# Patient Record
Sex: Female | Born: 1935 | Race: White | Hispanic: No | Smoking: Never smoker
Health system: Southern US, Community
[De-identification: ages and names within clinical notes are randomized; demographics above are authoritative.]

## PROBLEM LIST (undated history)

## (undated) DIAGNOSIS — R42 Dizziness and giddiness: Secondary | ICD-10-CM

## (undated) DIAGNOSIS — K219 Gastro-esophageal reflux disease without esophagitis: Secondary | ICD-10-CM

## (undated) DIAGNOSIS — G20 Parkinson's disease: Secondary | ICD-10-CM

## (undated) DIAGNOSIS — I2510 Atherosclerotic heart disease of native coronary artery without angina pectoris: Secondary | ICD-10-CM

## (undated) DIAGNOSIS — E785 Hyperlipidemia, unspecified: Secondary | ICD-10-CM

## (undated) DIAGNOSIS — M419 Scoliosis, unspecified: Secondary | ICD-10-CM

## (undated) DIAGNOSIS — F419 Anxiety disorder, unspecified: Secondary | ICD-10-CM

## (undated) DIAGNOSIS — R519 Headache, unspecified: Secondary | ICD-10-CM

## (undated) DIAGNOSIS — M545 Low back pain: Secondary | ICD-10-CM

## (undated) DIAGNOSIS — G8929 Other chronic pain: Secondary | ICD-10-CM

## (undated) DIAGNOSIS — I6509 Occlusion and stenosis of unspecified vertebral artery: Secondary | ICD-10-CM

## (undated) DIAGNOSIS — G459 Transient cerebral ischemic attack, unspecified: Secondary | ICD-10-CM

## (undated) DIAGNOSIS — R0602 Shortness of breath: Secondary | ICD-10-CM

## (undated) DIAGNOSIS — G20A1 Parkinson's disease without dyskinesia, without mention of fluctuations: Secondary | ICD-10-CM

## (undated) DIAGNOSIS — I10 Essential (primary) hypertension: Secondary | ICD-10-CM

## (undated) DIAGNOSIS — R51 Headache: Secondary | ICD-10-CM

## (undated) DIAGNOSIS — M1990 Unspecified osteoarthritis, unspecified site: Secondary | ICD-10-CM

## (undated) DIAGNOSIS — I2699 Other pulmonary embolism without acute cor pulmonale: Secondary | ICD-10-CM

## (undated) HISTORY — PX: VAGINAL HYSTERECTOMY: SUR661

## (undated) HISTORY — PX: CARDIAC VALVE REPLACEMENT: SHX585

---

## 1987-11-05 HISTORY — PX: CORONARY ANGIOPLASTY: SHX604

## 1998-02-15 ENCOUNTER — Ambulatory Visit (HOSPITAL_COMMUNITY): Admission: RE | Admit: 1998-02-15 | Discharge: 1998-02-15 | Payer: Self-pay | Admitting: *Deleted

## 1998-04-11 ENCOUNTER — Other Ambulatory Visit: Admission: RE | Admit: 1998-04-11 | Discharge: 1998-04-11 | Payer: Self-pay | Admitting: Obstetrics & Gynecology

## 1998-06-07 ENCOUNTER — Ambulatory Visit (HOSPITAL_COMMUNITY): Admission: RE | Admit: 1998-06-07 | Discharge: 1998-06-07 | Payer: Self-pay | Admitting: Cardiology

## 1998-11-04 DIAGNOSIS — I2510 Atherosclerotic heart disease of native coronary artery without angina pectoris: Secondary | ICD-10-CM

## 1998-11-04 HISTORY — PX: CHOLECYSTECTOMY: SHX55

## 1998-11-04 HISTORY — PX: CORONARY ARTERY BYPASS GRAFT: SHX141

## 1998-11-04 HISTORY — DX: Atherosclerotic heart disease of native coronary artery without angina pectoris: I25.10

## 1998-11-04 HISTORY — PX: AORTIC VALVE REPLACEMENT: SHX41

## 1999-07-19 ENCOUNTER — Encounter: Payer: Self-pay | Admitting: Cardiology

## 1999-07-19 ENCOUNTER — Inpatient Hospital Stay (HOSPITAL_COMMUNITY): Admission: AD | Admit: 1999-07-19 | Discharge: 1999-07-20 | Payer: Self-pay | Admitting: Cardiology

## 1999-07-20 ENCOUNTER — Encounter: Payer: Self-pay | Admitting: Cardiology

## 1999-07-31 ENCOUNTER — Inpatient Hospital Stay (HOSPITAL_COMMUNITY)
Admission: RE | Admit: 1999-07-31 | Discharge: 1999-08-21 | Payer: Self-pay | Admitting: Thoracic Surgery (Cardiothoracic Vascular Surgery)

## 1999-07-31 ENCOUNTER — Encounter: Payer: Self-pay | Admitting: Thoracic Surgery (Cardiothoracic Vascular Surgery)

## 1999-08-01 ENCOUNTER — Encounter: Payer: Self-pay | Admitting: Thoracic Surgery (Cardiothoracic Vascular Surgery)

## 1999-08-02 ENCOUNTER — Encounter: Payer: Self-pay | Admitting: Thoracic Surgery (Cardiothoracic Vascular Surgery)

## 1999-08-03 ENCOUNTER — Encounter: Payer: Self-pay | Admitting: Thoracic Surgery (Cardiothoracic Vascular Surgery)

## 1999-08-05 ENCOUNTER — Encounter: Payer: Self-pay | Admitting: Thoracic Surgery (Cardiothoracic Vascular Surgery)

## 1999-08-07 ENCOUNTER — Encounter: Payer: Self-pay | Admitting: Thoracic Surgery (Cardiothoracic Vascular Surgery)

## 1999-08-10 ENCOUNTER — Encounter: Payer: Self-pay | Admitting: Thoracic Surgery (Cardiothoracic Vascular Surgery)

## 1999-08-11 ENCOUNTER — Encounter: Payer: Self-pay | Admitting: Thoracic Surgery (Cardiothoracic Vascular Surgery)

## 1999-08-14 ENCOUNTER — Encounter: Payer: Self-pay | Admitting: Thoracic Surgery (Cardiothoracic Vascular Surgery)

## 1999-08-15 ENCOUNTER — Encounter: Payer: Self-pay | Admitting: Thoracic Surgery (Cardiothoracic Vascular Surgery)

## 1999-08-16 ENCOUNTER — Encounter: Payer: Self-pay | Admitting: Thoracic Surgery (Cardiothoracic Vascular Surgery)

## 1999-08-17 ENCOUNTER — Encounter: Payer: Self-pay | Admitting: Thoracic Surgery (Cardiothoracic Vascular Surgery)

## 1999-08-20 ENCOUNTER — Encounter: Payer: Self-pay | Admitting: Thoracic Surgery (Cardiothoracic Vascular Surgery)

## 2000-01-28 ENCOUNTER — Emergency Department (HOSPITAL_COMMUNITY): Admission: EM | Admit: 2000-01-28 | Discharge: 2000-01-28 | Payer: Self-pay | Admitting: Emergency Medicine

## 2001-05-11 ENCOUNTER — Emergency Department (HOSPITAL_COMMUNITY): Admission: EM | Admit: 2001-05-11 | Discharge: 2001-05-11 | Payer: Self-pay | Admitting: Emergency Medicine

## 2001-05-11 ENCOUNTER — Encounter: Payer: Self-pay | Admitting: Emergency Medicine

## 2002-07-26 ENCOUNTER — Ambulatory Visit (HOSPITAL_COMMUNITY): Admission: RE | Admit: 2002-07-26 | Discharge: 2002-07-26 | Payer: Self-pay | Admitting: Specialist

## 2002-07-26 ENCOUNTER — Encounter: Payer: Self-pay | Admitting: Specialist

## 2003-11-05 HISTORY — PX: CATARACT EXTRACTION W/ INTRAOCULAR LENS  IMPLANT, BILATERAL: SHX1307

## 2004-11-04 DIAGNOSIS — I6509 Occlusion and stenosis of unspecified vertebral artery: Secondary | ICD-10-CM

## 2004-11-04 HISTORY — DX: Occlusion and stenosis of unspecified vertebral artery: I65.09

## 2005-04-16 ENCOUNTER — Ambulatory Visit: Payer: Self-pay | Admitting: Internal Medicine

## 2005-04-16 ENCOUNTER — Inpatient Hospital Stay (HOSPITAL_COMMUNITY): Admission: EM | Admit: 2005-04-16 | Discharge: 2005-04-19 | Payer: Self-pay | Admitting: Emergency Medicine

## 2005-04-17 ENCOUNTER — Ambulatory Visit: Payer: Self-pay | Admitting: *Deleted

## 2005-07-05 ENCOUNTER — Emergency Department (HOSPITAL_COMMUNITY): Admission: EM | Admit: 2005-07-05 | Discharge: 2005-07-06 | Payer: Self-pay | Admitting: *Deleted

## 2007-04-02 ENCOUNTER — Emergency Department (HOSPITAL_COMMUNITY): Admission: EM | Admit: 2007-04-02 | Discharge: 2007-04-02 | Payer: Self-pay | Admitting: Emergency Medicine

## 2007-06-01 ENCOUNTER — Emergency Department (HOSPITAL_COMMUNITY): Admission: EM | Admit: 2007-06-01 | Discharge: 2007-06-01 | Payer: Self-pay | Admitting: Emergency Medicine

## 2009-02-04 ENCOUNTER — Encounter: Admission: RE | Admit: 2009-02-04 | Discharge: 2009-02-04 | Payer: Self-pay | Admitting: Orthopedic Surgery

## 2011-03-22 NOTE — Consult Note (Signed)
NAMEREMEE, CHARLEY                 ACCOUNT NO.:  [id number]   MEDICAL RECORD NO.:  [id number]          PATIENT TYPE:  INP   LOCATION:  2040                         FACILITY:  [facility]   PHYSICIAN:  Jesse Sans. Wall, M.D.   DATE OF BIRTH:  Jul 30, 1925   DATE OF CONSULTATION:  DATE OF DISCHARGE:                                   CONSULTATION   We were asked by the [facility] team to evaluate Kayla Ramos, a  very pleasant 77 year old white female well-known to me with a history of  coronary artery disease and aortic valve replacement for severe aortic  stenosis in September 2000.   At the time of her surgery, she had a left internal mammary graft to the  LAD, a vein graft to a third marginal, vein graft to the distal right by Dr.  Cornelius Moras.   She had a negative stress perfusion scan August 27, 2004, with an EF of  70%.   Yesterday morning she awoke about 5 a.m. and was dizzy.  She denied vertigo.  She was nauseous and then had a series of vomiting.  She called EMS and they  brought her to the hospital.   Her cardiac enzymes have been positive with a troponin of 0.13, now down to  0.09.  Total CPK was 148 with an MB of 4.9, index of only 3.3.  EKG has  shown ST segment depression inferiorly and laterally on admission, which was  different than in the past.   PAST MEDICAL HISTORY:  She has a history of hypertension, hyperlipidemia and  vertigo in 1992.  She is status post cataract surgery in 2005, status post  cholecystectomy in 2000.   She has no known drug allergies.   MEDICATIONS:  1.  Lisinopril 20 mg a day.  2.  Valium 5 mg daily.  3.  Meclizine 25 mg p.r.n.  4.  Lipitor 20 mg a day.  5.  Prilosec 20 mg daily.  6.  HCTZ 12.5 mg a day.   SOCIAL HISTORY:  She lives in [place].  She is a widow.  She has a son  who has passed away from cancer.  Her granddaughter Toniann Fail is here today.   She does not smoke or drink.   FAMILY HISTORY:  Mother died of a heart attack  at age 36.   REVIEW OF SYSTEMS:  Remarkable for the above HPI, otherwise negative.   PHYSICAL EXAMINATION:  VITAL SIGNS:  Her blood pressure is 120/56, her pulse  is 51 and regular.  She is in sinus bradycardia.  Temperature is 97.3.  Respiratory rate is 20.  She weighs 144 pounds.  Saturation is 92% on room  air.  GENERAL:  She is a very pleasant, quite humorous lady in no acute distress.  HEENT:  She has poor dentition.  HEENT otherwise is unremarkable.  Extraocular movements intact, PERRLA, and sclerae are clear.  NECK:  No JVD.  Carotid upstrokes are equal bilaterally with a positive left  carotid bruit.  CARDIOVASCULAR:  Remarkable for a 2/6 systolic murmur.  CHEST:  Lungs are clear  to auscultation and percussion.  SKIN:  Unremarkable.  ABDOMEN:  Nondistended, good bowel sounds, no tenderness, no  hepatosplenomegaly.  EXTREMITIES:  No cyanosis, clubbing or edema.  Pulses are 2+/4+ bilaterally  and symmetrically.  Femoral pulses are strong with no bruit.  NEUROLOGIC:  Grossly intact.   The CT of the head showed no acute abnormality.  Chest x-ray shows  cardiomegaly without congestive heart failure.  EKG as mentioned above.  She  is Hemoccult-negative.  BNP was 265.5 on admit.  Hemoglobin is 12.7,  platelets 228.  BUN and creatinine 14 and 1.0, potassium 4.5.  Cardiac  enzymes as above.   A 2-D echo is pending.   ASSESSMENT:  1.  Nausea and vomiting with dizziness, with significant electrocardiogram      changes on admission.  Her troponins are positive and are coming down,      consistent with an acute coronary syndrome.  2.  History of coronary artery disease, status post coronary artery bypass      grafting September 2000.  3.  History of aortic stenosis, status post porcine aortic valve      replacement.  4.  Hypertension.  5.  Hyperlipidemia.  6.  History of depression.  7.  History of gastroesophageal reflux.   PLAN:  1.  Check 2-D echocardiogram.  2.  Cardiac  catheterization, __________ left and grafts.  3.  Continue current medications with IV heparin, aspirin.   I have discussed the need for catheterization with the patient and her  granddaughter.  They agree and understand the indications, risks, potential  benefits, and agree to proceed.   Thank you very much for the consultation.       TCW/MEDQ  D:  04/17/2005  T:  04/17/2005  Job:  [id number]   cc:   Leonette Most Record  [address]  [place]  [place] [address]  Fax: [phone]

## 2011-03-22 NOTE — Cardiovascular Report (Signed)
Kayla Ramos, Kayla Ramos                 ACCOUNT NO.:  [id number]   MEDICAL RECORD NO.:  [id number]          PATIENT TYPE:  INP   LOCATION:  2040                         FACILITY:  [facility]   PHYSICIAN:  Carole Binning, M.D. LHCDATE OF BIRTH:  Jan 09, 1925   DATE OF PROCEDURE:  04/18/2005  DATE OF DISCHARGE:                              CARDIAC CATHETERIZATION   PROCEDURES PERFORMED:  1.  Left heart catheterization with coronary angiography.  2.  Bypass graft angiography.  3.  Left ventriculogram.  4.  Abdominal aortography.   INDICATIONS:  The patient is a 77 year old woman with a history of previous  coronary artery bypass surgery and aortic valve replacement.  She presented  to the hospital with symptoms of dizziness, nausea and vomiting.  An EKG  showed ST segment depression.  Cardiac markers revealed an elevated troponin  consistent with a possible small ST segment elevation myocardial infarction.  She was evaluated by Dr. Daleen Squibb and referred for cardiac catheterization.   PROCEDURE NOTE:  A 6 French sheath was placed in the reason for admission.  The patient was very hypertensive on presentation with systolic blood  pressure greater than 210 mmHg.  She was given initially intravenous  hydralazine with minimal response.  She was then given intravenous  labetalol, which decreased her blood pressures substantially.  The native  coronary arteries were imaged with 6 [place] JL4 and JR4 catheters.  The  saphenous vein grafts were imaged with a JL4 catheter.  The internal mammary  artery was imaged with a JR4 catheter.  Left ventriculography and abdominal  aortography were performed with an angled pigtail catheter.  Contrast was  Omnipaque.  There were no complications.   CATHETERIZATION RESULTS:  Hemodynamics:  The initial aortic systolic pressure was greater than 210  mmHg.  Subsequently it decreased to as low as 95/45.  Left ventricle  pressure was 105/20.  On catheter pullback across  the aortic valve there was  a gradient which was 15 mm peak gradient and 10 mm mean gradient across the  aortic valve.   Left ventriculogram:  The left ventricle was hyperdynamic.  The ejection  fraction is greater than 70%.  There is 1+ mitral regurgitation.   Abdominal aortogram reveals a 40% stenosis in the right renal artery and 20%  in the left renal artery.  The distal abdominal aorta and iliac arteries are  heavily calcified without obstructive disease.   Coronary arteriography:  The left main is normal.   The left anterior descending artery has a 30% stenosis in the proximal  vessel.  In the mid vessel there is a tubular 90% stenosis followed by a 60%  stenosis.  The distal LAD fills via a left internal mammary artery graft.  The LAD gives rise to a single large diagonal branch.  There is a 20%  stenosis in the proximal portion of the diagonal branch.   The left circumflex has a 70% stenosis in the ostium and a 75% stenosis in  the mid vessel.  The circumflex gives rise to a small first obtuse marginal,  a normal sized second  obtuse marginal and a normal sized third obtuse  marginal branch.  The third obtuse marginal fills via saphenous vein graft.   The right coronary artery is a dominant vessel.  There is 75% stenosis in  the ostium of the right coronary artery and a diffuse 75% stenosis in the  proximal vessel.  The mid vessel has diffuse 70% stenosis.  The distal right  coronary artery gives rise to a large posterior descending artery, a small  first posterolateral branch and a large second posterolateral branch.  The  distal right coronary artery fills via saphenous vein graft.   The left internal mammary artery to the distal LAD is patent throughout its  course filling the mid and distal LAD.   The saphenous vein graft to the third obtuse marginal branch is a slender  graft with some ectatic areas which appear to be secondary to venous valves.  However, this graft is  patent and free of obstructive disease filling a  normal sized third obtuse marginal.   The saphenous vein graft to the distal right coronary artery is widely  patent throughout its course filling the distal right coronary artery.   IMPRESSION:  1.  Hyperdynamic left ventricular systolic function.  2.  Mild aortic valve gradient, status post bioprosthetic aortic valve      replacement.  3.  Native three-vessel coronary artery disease.  4.  Status post coronary artery bypass surgery.  All grafts are patent.   In summary, the patient appears to be well revascularized.  No obvious  culprit is identified for the patient's elevated troponin level.   PLAN:  The patient will be managed medically.  We will check a D-dimer as a  screen for a possible pulmonary embolism.       MWP/MEDQ  D:  04/18/2005  T:  04/18/2005  Job:  [id number]   cc:   Leonette Most Record  [address]  [place]  [place] [address]  Fax: [phone]   Jesse Sans. Wall, M.D.   [facility]

## 2011-03-22 NOTE — Discharge Summary (Signed)
NAMENORVELL, URESTE                 ACCOUNT NO.:  [id number]   MEDICAL RECORD NO.:  [id number]          PATIENT TYPE:  INP   LOCATION:  2040                         FACILITY:  [facility]   PHYSICIAN:  Ileana Roup, M.D.  DATE OF BIRTH:  1925/04/01   DATE OF ADMISSION:  04/16/2005  DATE OF DISCHARGE:  04/19/2005                                 DISCHARGE SUMMARY   DISCHARGE DIAGNOSES:  1.  Dizziness, most probably secondary to inner ear pathology, question      vertebral TAA.  2.  Coronary artery disease.  3.  Hyperglycemia.  4.  Hypertension.  5.  Dyslipidemia.  6.  Aortic prosthesis valve.   DISCHARGE MEDICATIONS:  1.  Lisinopril 20 mg p.o. daily.  2.  Diazepam 5 mg p.o. daily.  3.  Meclizine 25 mg p.o. daily.  4.  Lipitor 20 mg p.o. daily.  5.  Hydrochlorothiazide 12.5 mg daily.  6.  Prilosec OTC 20 mg p.o. daily.   DISPOSITION:  The patient is to go home with hospital followup with her  primary physician, Dr. Record, on April 25, 2005 at 9:45 a.m.   CONSULTATIONS:  Dr. Valera Castle from cardiology.   PROCEDURES:  1.  Chest x-ray April 16, 2005:  Cardiomegaly without CHF.  2.  CT scan of the head without contrast on April 16, 2005:  Mild atrophy and      small vessel ischemic disease.  No acute or focal abnormalities.  3.  2-D echo on April 17, 2005:  Left ventricular systolic function was      hyperdynamic.  Left ventricular ejection fraction was estimated, range      being 75-85%.  There was moderate focal basal septal hypertrophy.  There      was a porcine bioprosthetic aortic valve.  The mean transaortic valve      gradient was 3.1 mmHg.  Mitral regurgitation was 1+ on a scale of 0 to      4+.  The left atrium was dilated.  Estimated peak right ventricular      systolic pressure was 50 mg in the range of 45-55.  Tricuspid      regurgitation grade was 1-2+ on a scale of 0 to 4.  4.  MRI of the brain:  1)  Atrophy and small vessel disease.  2)  No acute      stroke.  3) Mild  grade one enhancement of the meninges felt to be within      normal limits.  5.  MRA of the head:  75-90% stenosis distal right vertebral.  6.  MRI of the neck on April 17, 2005:  No evidence of carotid      atherosclerotic change.  75% distal right vertebral stenosis.  7.  Cardiac catheterization on April 18, 2005:  1) Hyperdynamic left      ventricular systolic function.  2) Mild aortic valve gradient, status      post bioprosthetic aortic valve replacement.  3) Patent native three-      vessel coronary artery disease.  4) Status post coronary artery bypass  surgery.  All grafts are patent.  In summary, the patient appeared to be      well revascularized.  No obvious culprit is identified for the patient's      elevated troponin level.  Plan: The patient will be managed medically.      We will check a D-dimer as a screen for a possible pulmonary embolism.   HISTORY OF PRESENT ILLNESS:  This 77 year old woman was admitted with  significant past medical history for coronary artery disease, status post  CABG in 2000, hypertension, dyslipidemia, GERD, vertigo.  Comes to the  emergency room complaining of dizziness.  She woke up around 5 or 5:30 a.m.  this morning and felt dizzy plus nausea.  She denies vertigo.  Then she  noticed also she was unbalanced.  Because of the persistence of nausea,  vomiting, dizziness, she decided to come to the emergency room and we saw  her this afternoon at the hospital.  She denied chest pain, shortness of  breath, headache, weakness, tinnitus, or fullness or hair loss.  No  hematemesis, no diarrhea.   PHYSICAL EXAMINATION:  VITAL SIGNS:  At physical examination, pulse was 102,  blood pressure 177/89, temperature 97.6, respiratory rate 28, oxygen  saturation 95% on room air.  HEENT:  Eyes were PERRLA.  She had a grade 1 nystagmus, unidirectional to  __________ with auditory component, rhythmic.   LABORATORY DATA ON ADMISSION:  Sodium 134, potassium 3.3,  chloride 97, CO2  22, BUN 19, creatinine 1.2, glucose 240.  CBC:  White blood cell count 15.6,  hemoglobin 15.4, hematocrit 44.1, platelets 291, MCV 88.5.   HOSPITAL COURSE:  Problem #1.  Dizziness.  The patient presented with  dizziness and no chest pain.  Also a nystagmus was found so after all the  work-up for stroke and also some __________ pathology, everything came back  negative. So this was thought to be secondary the either inner ear problems  or vertebral insufficiency.  The patient, after 12 hours of admission, was  continued on her meclizine and she was free of symptoms.  However, at the  initial work-up in the emergency room, cardiac enzymes were drawn and  troponin-I came back elevated.  For that reason, cardiology was consulted  who, because of the history of grafts, he decided to do a cardiac  catheterization.  Troponin went from 0.13 to 0.09 to 0.01.  As already  mentioned, cardiac catheterization was completely normal and the patient was  completely free of symptoms and for that reason it was decided her  discharge.   Problem #2.  Coronary artery disease.  She was continued on her regular  medication and she did not present any problem from this point of view.  Catheterization showed normal patent bypass.   Problem #3.  Hyperglycemia.  During this hospitalization, the patient  presented with some hyperglycemia with tendency to decrease.  However, she  continues to have some elevated glucoses in the a.m.  So for that reason,  this needed to be followed up as an outpatient.  She can be developing new  onset diabetes. Hemoglobin A1C was 5.7.   Problem #4.  Hypertension.  She was continued on her regular medications and  she did not present any problem from this point of view.   Problem #5.  Dyslipidemia.  Also she was continued on her regular  medications.  Problem #6.  Aortic prosthetic valve.  She did not present any problem from  this point of view  clinically or  at 2-D echo or at catheterization.  However, blood cultures were drawn x4 and one of them came back positive for  coagulase negative staph which we think is a contamination.  However, we did  two blood cultures on June 13 and two blood cultures on June 15 which at the  moment of her discharge were with no growth to date.  Blood culture need to  be followed up due to the history of prosthetic valve.   Finally, I had a conversation with primary physician, Dr. Record, about the  use of beta blockers in this patient with a history of coronary artery  disease.  However, he said that it was tried in the past.  However, the  patient got very tired with the use beta blockers.  For that reason, they  were stopped. We did not want to send her for this reason beta blockers and  this needs to be decided in the future by her primary care physician.   LABORATORY DATA ON DISCHARGE:  Sodium 138, potassium 4.1, chloride 110, CO2  25, glucose 79, BUN 3, creatinine 1, calcium 8.3.  CBC:  White blood cell  count 6, hemoglobin 11.4, hematocrit 32.4, MCV 89.3, platelet count 197.  D-  dimers were 0.45 so for that reason no further work-up was done.       YC/MEDQ  D:  04/22/2005  T:  04/23/2005  Job:  [id number]   cc:   Leonette Most Record  [address]  [place]  [place] [address]  Fax: [phone]

## 2011-03-22 NOTE — Op Note (Signed)
[facility]  Patient:    Kayla Ramos                         MRN: [id number] Proc. Date: 08/17/99 Adm. Date:  [id number] Attending:  Tressie Stalker CC:         Salvatore Decent. Cornelius Moras, M.D.                           Operative Report  PREOPERATIVE DIAGNOSIS:  Early acute cholecystitis.  POSTOPERATIVE DIAGNOSIS:  Acute cholecystitis.  PROCEDURE:  Laparoscopic cholecystectomy with intraoperative cholangiogram.  SURGEON: Adolph Pollack, M.D.  ASSISTANT:  Currie Paris, M.D.  ANESTHESIA:  General.  INDICATIONS:  This is a 77 year old female who underwent a coronary artery bypass and aortic valve replacement on July 31, 1999.  She had been having some nausea and vomiting and upper GI type complaints.  She had an ultrasound which showed some gallbladder sludge with thickened wall and a HIDA scan that showed nonvisualization of the gallbladder consistent with acute cystic duct obstruction. She is now brought to the operating room.  DESCRIPTION OF PROCEDURE:  She was placed supine on the operating table and general anesthetic was administered.  The abdomen was sterilely prepped and draped.  A subumbilical incision was made incising the skin sharply.  The subcutaneous tissue was dissected bluntly until the fascia was visualized.  A 1 cm incision was made in the fascia.  A purse-string suture of 0 Vicryl was placed around the fascial edges.  The peritoneal cavity was entered bluntly and under direct vision.  A Hasson trocar was introduced into the peritoneal cavity and a pneumoperitoneum was created by insufflation of CO2 gas.  The laparoscope was introduced. The gallbladder was irregular in color and appeared edematous.  Next, a 10 mm incision was made in the epigastrium and a similar sized trocar was placed.  Two 5 mm trocars were placed in the right abdomen.  The fundus of the gallbladder was grasped and omental adhesions and  duodenal adhesions were taken down from the gallbladder bluntly.  The infundibulum of the gallbladder was identified and retracted toward to right.  Using careful blunt dissection, I was able to identify the cystic duct and its junction with the gallbladder.  I ______ for some distance.  I also identified an anterior and posterior branch of the cystic artery.  I clipped the cystic duct at its junction with the gallbladder nd performed a partial ductotomy.  I milked some sludge out of the cystic duct.  A  cholangiocath was then introduced to the anterior abdominal wall and a cholangiogram was performed which demonstrated no common duct filling defects.  This will be more detailed in a separate report.  The cholangiocatheter was then removed, the cystic duct was clipped three times  proximally and then divided.  The anterior branch and the posterior branch of the cystic artery were clipped twice proximally and once distally and divided.  The  gallbladder was then dissected free from the liver bed.  There was a small amount of bile spillage and this was evacuated.  The gallbladder was then placed in an  Endocatch bag. Small flecks of stone were noted.  This was consistent with microlithiasis.  The perihepatic area was copiously irrigated with saline and clots and any type of bilious fluid evacuated.  No bile leakage or bleeding was noted from the liver ed after it  had been cauterized.  A piece of Surgicel was placed in the liver bed because the patient was likely going to go back on the Coumadin.  The gallbladder was removed through the subumbilical port and the fascial defect was closed by tightening up and tying down the purse-string suture.  The perihepatic area was once again irrigated and the fluid was evacuated.  All trocars were removed and the pneumoperitoneum was released.  The skin incisions were then closed with 4-0 Monocryl subcuticular stitches followed by  Steri-Strips and sterile dressings.  She tolerated the procedure well without any apparent complications.  She will remain on intravenous antibiotics.  She was taken to the recovery room in satisfactory condition. DD:  08/17/99 TD:  08/19/99 Job: [id number]

## 2011-04-11 ENCOUNTER — Emergency Department (INDEPENDENT_AMBULATORY_CARE_PROVIDER_SITE_OTHER): Payer: Medicare Other

## 2011-04-11 ENCOUNTER — Emergency Department (HOSPITAL_BASED_OUTPATIENT_CLINIC_OR_DEPARTMENT_OTHER)
Admission: EM | Admit: 2011-04-11 | Discharge: 2011-04-11 | Disposition: A | Discharge status: Home / Self Care | Payer: Medicare Other | Attending: Emergency Medicine | Admitting: Emergency Medicine

## 2011-04-11 DIAGNOSIS — I517 Cardiomegaly: Secondary | ICD-10-CM

## 2011-04-11 DIAGNOSIS — G8929 Other chronic pain: Secondary | ICD-10-CM | POA: Insufficient documentation

## 2011-04-11 DIAGNOSIS — W19XXXA Unspecified fall, initial encounter: Secondary | ICD-10-CM

## 2011-04-11 DIAGNOSIS — Y92009 Unspecified place in unspecified non-institutional (private) residence as the place of occurrence of the external cause: Secondary | ICD-10-CM | POA: Insufficient documentation

## 2011-04-11 DIAGNOSIS — I10 Essential (primary) hypertension: Secondary | ICD-10-CM | POA: Insufficient documentation

## 2011-04-11 DIAGNOSIS — R51 Headache: Secondary | ICD-10-CM

## 2011-04-11 DIAGNOSIS — E785 Hyperlipidemia, unspecified: Secondary | ICD-10-CM | POA: Insufficient documentation

## 2011-04-11 DIAGNOSIS — R55 Syncope and collapse: Secondary | ICD-10-CM

## 2011-04-11 DIAGNOSIS — Z79899 Other long term (current) drug therapy: Secondary | ICD-10-CM | POA: Insufficient documentation

## 2011-04-11 DIAGNOSIS — G319 Degenerative disease of nervous system, unspecified: Secondary | ICD-10-CM

## 2011-04-11 LAB — BASIC METABOLIC PANEL
BUN: 18 mg/dL (ref 6–23)
CO2: 24 mEq/L (ref 19–32)
GFR calc Af Amer: >60 mL/min (ref >60)
GFR calc non Af Amer: >60 mL/min (ref >60)
Potassium: 3.8 mEq/L (ref 3.5–5.1)
Sodium: 145 mEq/L (ref 135–145)

## 2011-04-11 LAB — CBC
MCH: 30.9 pg (ref 26.0–34.0)
MCV: 90.7 fL (ref 78.0–100.0)
Platelets: 207 10*3/uL (ref 150–400)
RDW: 14.1 % (ref 11.5–15.5)

## 2011-04-11 LAB — DIFFERENTIAL
Basophils Absolute: 0 10*3/uL (ref 0.0–0.1)
Basophils Relative: 0 % (ref 0–1)
Eosinophils Absolute: 0.1 10*3/uL (ref 0.0–0.7)
Monocytes Absolute: 0.7 10*3/uL (ref 0.1–1.0)
Neutro Abs: 5.7 10*3/uL (ref 1.7–7.7)
Neutrophils Relative %: 76 % (ref 43–77)

## 2011-04-11 LAB — TROPONIN I: Troponin I: <0.3 ng/mL (ref <0.30)

## 2011-04-11 LAB — URINALYSIS, ROUTINE W REFLEX MICROSCOPIC
Ketones, ur: NEGATIVE mg/dL
Leukocytes, UA: NEGATIVE
Specific Gravity, Urine: 1.012 (ref 1.005–1.030)
pH: 5.5 (ref 5.0–8.0)

## 2011-04-11 LAB — URINE MICROSCOPIC-ADD ON

## 2011-04-12 LAB — URINE CULTURE: Culture: NO GROWTH

## 2011-07-06 DIAGNOSIS — G459 Transient cerebral ischemic attack, unspecified: Secondary | ICD-10-CM

## 2011-07-06 HISTORY — DX: Transient cerebral ischemic attack, unspecified: G45.9

## 2011-09-05 ENCOUNTER — Emergency Department (HOSPITAL_BASED_OUTPATIENT_CLINIC_OR_DEPARTMENT_OTHER)
Admission: EM | Admit: 2011-09-05 | Discharge: 2011-09-06 | Disposition: A | Discharge status: Other Acute Inpatient Hospital | Payer: Medicare Other | Attending: Emergency Medicine | Admitting: Emergency Medicine

## 2011-09-05 ENCOUNTER — Emergency Department (HOSPITAL_BASED_OUTPATIENT_CLINIC_OR_DEPARTMENT_OTHER): Payer: Medicare Other

## 2011-09-05 ENCOUNTER — Other Ambulatory Visit: Payer: Self-pay

## 2011-09-05 ENCOUNTER — Encounter: Payer: Self-pay | Admitting: *Deleted

## 2011-09-05 DIAGNOSIS — I2510 Atherosclerotic heart disease of native coronary artery without angina pectoris: Secondary | ICD-10-CM | POA: Insufficient documentation

## 2011-09-05 DIAGNOSIS — R5381 Other malaise: Secondary | ICD-10-CM | POA: Insufficient documentation

## 2011-09-05 DIAGNOSIS — G8929 Other chronic pain: Secondary | ICD-10-CM | POA: Insufficient documentation

## 2011-09-05 DIAGNOSIS — R001 Bradycardia, unspecified: Secondary | ICD-10-CM

## 2011-09-05 DIAGNOSIS — E785 Hyperlipidemia, unspecified: Secondary | ICD-10-CM | POA: Insufficient documentation

## 2011-09-05 DIAGNOSIS — R29810 Facial weakness: Secondary | ICD-10-CM | POA: Insufficient documentation

## 2011-09-05 DIAGNOSIS — G319 Degenerative disease of nervous system, unspecified: Secondary | ICD-10-CM | POA: Insufficient documentation

## 2011-09-05 DIAGNOSIS — M75100 Unspecified rotator cuff tear or rupture of unspecified shoulder, not specified as traumatic: Secondary | ICD-10-CM | POA: Insufficient documentation

## 2011-09-05 DIAGNOSIS — R531 Weakness: Secondary | ICD-10-CM

## 2011-09-05 DIAGNOSIS — IMO0002 Reserved for concepts with insufficient information to code with codable children: Secondary | ICD-10-CM | POA: Insufficient documentation

## 2011-09-05 DIAGNOSIS — M549 Dorsalgia, unspecified: Secondary | ICD-10-CM | POA: Insufficient documentation

## 2011-09-05 DIAGNOSIS — I10 Essential (primary) hypertension: Secondary | ICD-10-CM | POA: Insufficient documentation

## 2011-09-05 DIAGNOSIS — M4120 Other idiopathic scoliosis, site unspecified: Secondary | ICD-10-CM | POA: Insufficient documentation

## 2011-09-05 DIAGNOSIS — R42 Dizziness and giddiness: Secondary | ICD-10-CM | POA: Insufficient documentation

## 2011-09-05 DIAGNOSIS — I498 Other specified cardiac arrhythmias: Secondary | ICD-10-CM | POA: Insufficient documentation

## 2011-09-05 HISTORY — DX: Essential (primary) hypertension: I10

## 2011-09-05 HISTORY — DX: Hyperlipidemia, unspecified: E78.5

## 2011-09-05 HISTORY — DX: Dizziness and giddiness: R42

## 2011-09-05 HISTORY — DX: Atherosclerotic heart disease of native coronary artery without angina pectoris: I25.10

## 2011-09-05 HISTORY — DX: Scoliosis, unspecified: M41.9

## 2011-09-05 LAB — CBC
HCT: 37.9 % (ref 36.0–46.0)
Hemoglobin: 12.5 g/dL (ref 12.0–15.0)
MCHC: 33 g/dL (ref 30.0–36.0)
RBC: 4.08 MIL/uL (ref 3.87–5.11)
WBC: 12.8 10*3/uL — ABNORMAL HIGH (ref 4.0–10.5)

## 2011-09-05 LAB — URINALYSIS, ROUTINE W REFLEX MICROSCOPIC
Bilirubin Urine: NEGATIVE
Glucose, UA: NEGATIVE mg/dL
Ketones, ur: NEGATIVE mg/dL
Leukocytes, UA: NEGATIVE
Specific Gravity, Urine: 1.015 (ref 1.005–1.030)
pH: 5.5 (ref 5.0–8.0)

## 2011-09-05 LAB — DIFFERENTIAL
Lymphocytes Relative: 13 % (ref 12–46)
Lymphs Abs: 1.6 10*3/uL (ref 0.7–4.0)
Monocytes Absolute: 0.8 10*3/uL (ref 0.1–1.0)
Monocytes Relative: 6 % (ref 3–12)
Neutro Abs: 10.2 10*3/uL — ABNORMAL HIGH (ref 1.7–7.7)

## 2011-09-05 NOTE — ED Notes (Signed)
Pt states she had an episode earlier of dizziness lasting "just a few seconds"-pt denies pain-grand daughter states pt has been weak and concerned that same symptoms in the past with UTI

## 2011-09-05 NOTE — ED Provider Notes (Addendum)
History     CSN: [id number] Arrival date & time: 09/05/2011 10:19 PM   First MD Initiated Contact with Patient 09/05/11 2328      Chief Complaint  Patient presents with  . Fatigue    (Consider location/radiation/quality/duration/timing/severity/associated sxs/prior treatment) HPI History provided primarily by the patient's granddaughter. This is an 77 year old white female who has been feeling less energetic than usual for about a week which became profoundly worse today. She's been unable to get out of bed or to ambulate. She has not had any focal weakness except for mild right facial droop noted this afternoon. She's not had a fever. She has not been vomiting. She has not had any chest pain, dyspnea, abdominal pain or diarrhea. She has a history of similar symptoms due to a UTI in the past. Her symptoms are described as moderate to severe.  Past Medical History  Diagnosis Date  . Hypertension   . Vertigo   . CAD (coronary artery disease)   . Hyperlipidemia   . Chronic back pain   . Scoliosis     Past Surgical History  Procedure Date  . Coronary artery bypass graft   . Cholecystectomy     History reviewed. No pertinent family history.  History  Substance Use Topics  . Smoking status: Never Smoker   . Smokeless tobacco: Not on file  . Alcohol Use: No    OB History    Grav Para Term Preterm Abortions TAB SAB Ect Mult Living                  Review of Systems  All other systems reviewed and are negative.    Allergies  Review of patient's allergies indicates no known allergies.  Home Medications   Current Outpatient Rx  Name Route Sig Dispense Refill  . ATORVASTATIN CALCIUM 40 MG PO TABS Oral Take 40 mg by mouth daily.      Marland Kitchen DIAZEPAM 5 MG PO TABS Oral Take 5 mg by mouth 2 (two) times daily as needed. For anxiety    . FELODIPINE 5 MG PO TB24 Oral Take 5 mg by mouth daily.      Marland Kitchen GABAPENTIN 300 MG PO CAPS Oral Take 100-300 mg by mouth at bedtime as needed.  For chronic pain     . HYDROCODONE-ACETAMINOPHEN 7.5-325 MG PO TABS Oral Take 1 tablet by mouth 3 (three) times daily as needed. For severe pain     . LISINOPRIL 20 MG PO TABS Oral Take 20 mg by mouth daily.      Marland Kitchen MECLIZINE HCL 25 MG PO TABS Oral Take 25 mg by mouth 3 (three) times daily as needed. For dizziness     . MELOXICAM 7.5 MG PO TABS Oral Take 7.5 mg by mouth daily.        BP 149/62  Pulse 62  Temp(Src) 98.1 F (36.7 C) (Rectal)  Resp 16  Wt 123 lb (55.792 kg)  SpO2 93%  Physical Exam General: Well-developed, well-nourished female in no acute distressHENT: normocephalic, atraumatic Eyes: pupils equal round and reactive to light; extraocular muscles intact Neck: supple Heart: regular rate and rhythm; no murmurs Lungs: clear to auscultation bilaterally Abdomen: soft; nontender; nondistended; bowel sounds present Extremities: No deformity; no edema; pulses normal Neurologic: Awake, lethargic but oriented; motor function intact in all extremities and symmetric; mild right facial droop  Skin: Warm and dry    ED Course  Procedures (including critical care time)    MDM   EKG Interpretation:  Date & Time: 09/05/2011 10:45 PM  Rate: 59  Rhythm: sinus bradycardia  QRS Axis: normal  Intervals: normal  ST/T Wave abnormalities: T-wave inversions inferiorly and laterally  Conduction Disutrbances:poor R-wave progression  Narrative Interpretation:   Old EKG Reviewed: none available  Nursing notes and vitals signs, including pulse oximetry, reviewed.  Summary of this visit's results, reviewed by myself:  Labs:  Results for orders placed during the hospital encounter of 09/05/11  CBC      Component Value Range   WBC 12.8 (*) 4.0 - 10.5 (K/uL)   RBC 4.08  3.87 - 5.11 (MIL/uL)   Hemoglobin 12.5  12.0 - 15.0 (g/dL)   HCT 14.7  82.9 - 56.2 (%)   MCV 92.9  78.0 - 100.0 (fL)   MCH 30.6  26.0 - 34.0 (pg)   MCHC 33.0  30.0 - 36.0 (g/dL)   RDW 13.0  86.5 - 78.4 (%)    Platelets 217  150 - 400 (K/uL)  DIFFERENTIAL      Component Value Range   Neutrophils Relative 80 (*) 43 - 77 (%)   Neutro Abs 10.2 (*) 1.7 - 7.7 (K/uL)   Lymphocytes Relative 13  12 - 46 (%)   Lymphs Abs 1.6  0.7 - 4.0 (K/uL)   Monocytes Relative 6  3 - 12 (%)   Monocytes Absolute 0.8  0.1 - 1.0 (K/uL)   Eosinophils Relative 1  0 - 5 (%)   Eosinophils Absolute 0.2  0.0 - 0.7 (K/uL)   Basophils Relative 0  0 - 1 (%)   Basophils Absolute 0.0  0.0 - 0.1 (K/uL)  BASIC METABOLIC PANEL      Component Value Range   Sodium 141  135 - 145 (mEq/L)   Potassium 3.7  3.5 - 5.1 (mEq/L)   Chloride 107  96 - 112 (mEq/L)   CO2 25  19 - 32 (mEq/L)   Glucose, Bld 102 (*) 70 - 99 (mg/dL)   BUN 29 (*) 6 - 23 (mg/dL)   Creatinine, Ser 6.96  0.50 - 1.10 (mg/dL)   Calcium 8.7  8.4 - 29.5 (mg/dL)   GFR calc non Af Amer 45 (*) >90 (mL/min)   GFR calc Af Amer 52 (*) >90 (mL/min)  URINALYSIS, ROUTINE W REFLEX MICROSCOPIC      Component Value Range   Color, Urine YELLOW  YELLOW    Appearance CLEAR  CLEAR    Specific Gravity, Urine 1.015  1.005 - 1.030    pH 5.5  5.0 - 8.0    Glucose, UA NEGATIVE  NEGATIVE (mg/dL)   Hgb urine dipstick NEGATIVE  NEGATIVE    Bilirubin Urine NEGATIVE  NEGATIVE    Ketones, ur NEGATIVE  NEGATIVE (mg/dL)   Protein, ur NEGATIVE  NEGATIVE (mg/dL)   Urobilinogen, UA 0.2  0.0 - 1.0 (mg/dL)   Nitrite NEGATIVE  NEGATIVE    Leukocytes, UA NEGATIVE  NEGATIVE   TROPONIN I      Component Value Range   Troponin I <0.30  <0.30 (ng/mL)    Dg Chest 2 View  09/06/2011  *RADIOLOGY REPORT*  Clinical Data: Weakness.  Hyperglycemia.  CHEST - 2 VIEW  Comparison: 04/11/2011  Findings: Stable postoperative changes in the mediastinum.  Cardiac enlargement with mild increased pulmonary vascularity.  No perihilar edema.  No blunting of costophrenic angles.  Linear atelectasis or fibrosis in the lung bases.  No focal airspace consolidation.  Calcified and tortuous aorta.  No significant changes  since the previous study, allowing  for differences in technique.  IMPRESSION: Cardiac enlargement with mild prominence of pulmonary vascularity. No evidence of pulmonary edema or consolidation.  Original Report Authenticated By: Marlon Pel, M.D.   Ct Head Wo Contrast  09/06/2011  *RADIOLOGY REPORT*  Clinical Data: Fatigue, weakness, dizziness, right facial droop.  CT HEAD WITHOUT CONTRAST  Technique:  Contiguous axial images were obtained from the base of the skull through the vertex without contrast.  Comparison: 04/11/2011  Findings: Diffuse cerebral atrophy.  Low attenuation change in the deep white matter consistent with small vessel ischemic change.  No mass effect or midline shift.  No abnormal extra-axial fluid collections.  Ventricular dilatation consistent with central atrophy.  Gray-white matter junctions are distinct.  Basal cisterns are not effaced.  No evidence of acute intracranial hemorrhage. Stable appearance of the intracranial contents since the previous study.  Visualized paranasal sinuses are not opacified.  No depressed skull fractures.  Vascular calcifications in the intracranial arteries.  IMPRESSION: Diffuse atrophy and small vessel ischemic changes.  No evidence of acute intracranial hemorrhage, mass lesion, or acute infarct.  No significant change since previous study.  Original Report Authenticated By: Marlon Pel, M.D.            Hanley Seamen, MD 09/06/11 0139  Hanley Seamen, MD 09/06/11 7829

## 2011-09-05 NOTE — ED Notes (Signed)
CBG 97 per EMS.

## 2011-09-05 NOTE — ED Notes (Signed)
Family states that in the past when pt has been weak like this it was caused by an UTI. Pt wears a depends at all times.

## 2011-09-05 NOTE — ED Notes (Signed)
Pt states that she has had generalized weakness since yesterday. No complaints of pain, no N/V/D

## 2011-09-06 ENCOUNTER — Emergency Department (INDEPENDENT_AMBULATORY_CARE_PROVIDER_SITE_OTHER): Payer: Medicare Other

## 2011-09-06 ENCOUNTER — Inpatient Hospital Stay (HOSPITAL_COMMUNITY)
Admission: RE | Admit: 2011-09-06 | Discharge: 2011-09-10 | DRG: 069 | Disposition: A | Discharge status: Home / Self Care | Payer: Medicare Other | Source: Other Acute Inpatient Hospital | Attending: Family Medicine | Admitting: Family Medicine

## 2011-09-06 ENCOUNTER — Inpatient Hospital Stay (HOSPITAL_COMMUNITY): Payer: Medicare Other

## 2011-09-06 ENCOUNTER — Other Ambulatory Visit: Payer: Self-pay

## 2011-09-06 ENCOUNTER — Emergency Department (HOSPITAL_BASED_OUTPATIENT_CLINIC_OR_DEPARTMENT_OTHER): Payer: Medicare Other

## 2011-09-06 DIAGNOSIS — Z79899 Other long term (current) drug therapy: Secondary | ICD-10-CM

## 2011-09-06 DIAGNOSIS — Z7902 Long term (current) use of antithrombotics/antiplatelets: Secondary | ICD-10-CM

## 2011-09-06 DIAGNOSIS — R5383 Other fatigue: Secondary | ICD-10-CM

## 2011-09-06 DIAGNOSIS — I517 Cardiomegaly: Secondary | ICD-10-CM

## 2011-09-06 DIAGNOSIS — I10 Essential (primary) hypertension: Secondary | ICD-10-CM | POA: Diagnosis present

## 2011-09-06 DIAGNOSIS — R7309 Other abnormal glucose: Secondary | ICD-10-CM

## 2011-09-06 DIAGNOSIS — I369 Nonrheumatic tricuspid valve disorder, unspecified: Secondary | ICD-10-CM

## 2011-09-06 DIAGNOSIS — I6509 Occlusion and stenosis of unspecified vertebral artery: Secondary | ICD-10-CM | POA: Diagnosis present

## 2011-09-06 DIAGNOSIS — E785 Hyperlipidemia, unspecified: Secondary | ICD-10-CM | POA: Diagnosis present

## 2011-09-06 DIAGNOSIS — R42 Dizziness and giddiness: Secondary | ICD-10-CM

## 2011-09-06 DIAGNOSIS — I2510 Atherosclerotic heart disease of native coronary artery without angina pectoris: Secondary | ICD-10-CM | POA: Diagnosis present

## 2011-09-06 DIAGNOSIS — R5381 Other malaise: Secondary | ICD-10-CM

## 2011-09-06 DIAGNOSIS — Z9889 Other specified postprocedural states: Secondary | ICD-10-CM

## 2011-09-06 DIAGNOSIS — Z951 Presence of aortocoronary bypass graft: Secondary | ICD-10-CM

## 2011-09-06 DIAGNOSIS — G319 Degenerative disease of nervous system, unspecified: Secondary | ICD-10-CM

## 2011-09-06 DIAGNOSIS — R350 Frequency of micturition: Secondary | ICD-10-CM | POA: Diagnosis present

## 2011-09-06 DIAGNOSIS — I6789 Other cerebrovascular disease: Secondary | ICD-10-CM

## 2011-09-06 DIAGNOSIS — G459 Transient cerebral ischemic attack, unspecified: Principal | ICD-10-CM | POA: Diagnosis present

## 2011-09-06 DIAGNOSIS — R739 Hyperglycemia, unspecified: Secondary | ICD-10-CM

## 2011-09-06 DIAGNOSIS — I6501 Occlusion and stenosis of right vertebral artery: Secondary | ICD-10-CM | POA: Diagnosis present

## 2011-09-06 LAB — GLUCOSE, CAPILLARY

## 2011-09-06 LAB — COMPREHENSIVE METABOLIC PANEL
ALT: 9 U/L (ref 0–35)
AST: 16 U/L (ref 0–37)
Alkaline Phosphatase: 86 U/L (ref 39–117)
Calcium: 9 mg/dL (ref 8.4–10.5)
GFR calc Af Amer: 61 mL/min — ABNORMAL LOW (ref >90)
Glucose, Bld: 133 mg/dL — ABNORMAL HIGH (ref 70–99)
Potassium: 3.8 mEq/L (ref 3.5–5.1)
Sodium: 145 mEq/L (ref 135–145)
Total Protein: 6 g/dL (ref 6.0–8.3)

## 2011-09-06 LAB — BASIC METABOLIC PANEL
BUN: 29 mg/dL — ABNORMAL HIGH (ref 6–23)
CO2: 25 mEq/L (ref 19–32)
Chloride: 107 mEq/L (ref 96–112)
Creatinine, Ser: 1.1 mg/dL (ref 0.50–1.10)
Potassium: 3.7 mEq/L (ref 3.5–5.1)

## 2011-09-06 LAB — CBC
Hemoglobin: 12.6 g/dL (ref 12.0–15.0)
MCH: 30.9 pg (ref 26.0–34.0)
MCV: 94.1 fL (ref 78.0–100.0)
Platelets: 217 10*3/uL (ref 150–400)
RBC: 4.08 MIL/uL (ref 3.87–5.11)
WBC: 7.9 10*3/uL (ref 4.0–10.5)

## 2011-09-06 LAB — CARDIAC PANEL(CRET KIN+CKTOT+MB+TROPI)
Relative Index: INVALID (ref 0.0–2.5)
Troponin I: <0.3 ng/mL (ref <0.30)
Troponin I: <0.3 ng/mL (ref <0.30)

## 2011-09-06 LAB — APTT: aPTT: 34 seconds (ref 24–37)

## 2011-09-06 LAB — PROTIME-INR: Prothrombin Time: 14.6 seconds (ref 11.6–15.2)

## 2011-09-06 MED ORDER — GADOBENATE DIMEGLUMINE 529 MG/ML IV SOLN
15.0000 mL | Freq: Once | INTRAVENOUS | Status: AC
  Administered 2011-09-06: 15 mL via INTRAVENOUS

## 2011-09-06 MED ORDER — SODIUM CHLORIDE 0.9 % IV SOLN: Freq: Once | INTRAVENOUS | Status: DC

## 2011-09-06 MED ORDER — ONDANSETRON HCL 4 MG/2ML IJ SOLN: 4.0000 mg | Freq: Four times a day (QID) | INTRAMUSCULAR | Status: DC | PRN

## 2011-09-06 MED ORDER — ASPIRIN EC 325 MG PO TBEC: 325.0000 mg | DELAYED_RELEASE_TABLET | Freq: Every day | ORAL | Status: DC

## 2011-09-06 MED ORDER — ZOLPIDEM TARTRATE 5 MG PO TABS: 5.0000 mg | ORAL_TABLET | Freq: Every evening | ORAL | Status: DC | PRN

## 2011-09-06 MED ORDER — ONDANSETRON HCL 4 MG PO TABS: 4.0000 mg | ORAL_TABLET | Freq: Four times a day (QID) | ORAL | Status: DC | PRN

## 2011-09-06 MED ORDER — SULFAMETHOXAZOLE-TMP DS 800-160 MG PO TABS
1.0000 | ORAL_TABLET | Freq: Two times a day (BID) | ORAL | Status: DC
  Filled 2011-09-06 (×2): qty 1

## 2011-09-06 MED ORDER — MORPHINE SULFATE 2 MG/ML IJ SOLN: 2.0000 mg | INTRAMUSCULAR | Status: DC | PRN

## 2011-09-06 NOTE — ED Notes (Signed)
Pt report given to Sean, RN with [facility] 

## 2011-09-06 NOTE — ED Notes (Signed)
Pt report given to Jonny Ruiz, RN at [facility].

## 2011-09-06 NOTE — ED Notes (Signed)
Pt placed on bedpan per pt request, pt voided small amount of clear, yellow urine. Peri care given and pt placed in brief per family request. Pt tolerated well.

## 2011-09-06 NOTE — ED Notes (Signed)
Pt to radiology dept via stretcher.

## 2011-09-06 NOTE — H&P (Signed)
  NAME:  Kayla Ramos, Kayla Ramos NO.:  [id number]  MEDICAL RECORD NO.:  [id number]  LOCATION:  2020                         FACILITY:  [facility]  PHYSICIAN:  Pleas Koch, MD        DATE OF BIRTH:  [birth year]  DATE OF ADMISSION:  09/06/2011 DATE OF DISCHARGE:                             HISTORY & PHYSICAL   ADDENDUM:  I did speak to the patient's __________ and she states that the patient was not able to walk at all yesterday and when the aid got her at around 12 p.m., contrary to patient's report, the patient was leaning to the right side and mouth was droopy.  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  The patient will undergo an MRI, MRA, and we will risk stratify her and get a lipid panel in the morning.          ______________________________ Pleas Koch, MD     JS/MEDQ  D:  09/06/2011  T:  09/06/2011  Job:  [id number]  Electronically Signed by Pleas Koch MD on 09/06/2011 09:16:21 PM

## 2011-09-06 NOTE — H&P (Signed)
Kayla Ramos, Kayla Ramos                 ACCOUNT NO.:  [id number]  MEDICAL RECORD NO.:  [id number]  LOCATION:  2020                         FACILITY:  [facility]  PHYSICIAN:  Pleas Koch, MD        DATE OF BIRTH:  07/07/1925  DATE OF ADMISSION:  09/06/2011 DATE OF DISCHARGE:                             HISTORY & PHYSICAL   CHIEF COMPLAINT:  Weakness.  The patient is a very pleasant 77 year old Caucasian female who presented to [facility], subsequent to being seen by her granddaughter yesterday at 8 a.m.  Per the patient, she states that she usually is able to get up out of bed from a sitting position, however, has not been able to do so for the past year.  Her granddaughter came to the house and because the patient was not able to get out of bed from a recumbent laying position, it is noted that the patient was brought over to the [facility] because there was some noted weakness and concerned for slight increase in right-sided facial droop.  She denies any specific weakness on any one-sided body, any seizure-like symptoms, any loss of continence, any chest pain.  She denies dysuria, but does have frequency of urination, which is not new.  She denies any fever. Denies any productive sputum or phlegm or any current dizziness, but has had in the past dizziness with change in position of her head.  She also states that she has had some leg swelling about the past month, which is unexplained.  Specifically asked, she denies any paroxysmal nocturnal dyspnea or any orthopnea.  She has not had any increase in pillow use in the recent past and cannot explain the swelling in her lower extremities.  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  PAST MEDICAL HISTORY:  Gleaned from the patient as she does not recall everything, but I looked back through E-chart as well.  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  The patient also has a history previous coronary artery bypass grafting and aortic valve replacement she states in 2000 with a pig valve and had a subsequent cardiac catheterizations in 2006, which showed EF of greater than 70%, +1 mitral regurg, 40% stenosis of right renal artery and 20% left renal artery.  She had tubular 90% stenosis in the mid LAD at that point in time as well, and she is a Adult nurse patient.  She looks like in 2006 she had a workup for stroke and everything came back negative and it was thought that she had vertebral insufficiency versus inner ear pathology and 12 hours after admission and with the use of meclizine, she was completely free of  symptoms.  She has a history of hyperglycemia, and her A1c done per report at that time was 5.7.  It is unclear at this point whether she is considered as diabetic.  Looking back through the emergency room notes from Dr. Read Drivers, her medications appeared to be as follows: 1. Atorvastatin 40 mg daily. 2. Diazepam 5 mg daily. 3. Felodipine 5 mg q.24 h. 4. Gabapentin 1-300 mg daily at bedtime for chronic pain. 5. Percocet 7.5/325 for severe pain. 6. Lisinopril 20 mg daily. 7. Meclizine 25 mg p.o. 3 times a day as needed for dizziness. 8. Meloxicam 7.5 mg p.o. daily. Vitals at the emergency room showed blood pressure of 149/62.  PAST SURGICAL HISTORY:  Noncontributory.  SOCIAL HISTORY:  Husband died in 38.  She has a Comptroller at night.  She has had 1-2 falls in the spring, but has not had any since.  She states that the  droop on the right side of her face was secondary to 2 teeth being removed this year, but I am not able to confirm this with other relatives.  She has no known drug allergies.  She has a son who died in 06-Apr-2004 and Wendy Dunnett, who can be reached at phone number [phone], seems to be her next of kin.  She is a Engineer, site.  The patient was never a smoker or drinker.  PHYSICAL EXAMINATION:  GENERAL:  The patient is a very pleasant, frail Caucasian female, looking appropriate age. HEENT:  Funduscopy was deferred.  She has no pallor.  No icterus.  She has arcus senilis.  She has very poor dentition and dry mucosa.  Her uvula is midline.  She does have some twisting of the mouth downward on the right side, however, verbalizes well and does not seem to have any difficulty with naming words or with articulation.  Extraocular movements are intact and visual fields by dark confrontation is normal. NECK:  I am not able to appreciate any JVD and her carotids are negative for bruit. CHEST:  She has mild crackles in the right lower basal lung fields; however, no tactile vocal fremitus or resonance. LUNGS:  Sounds equal bilaterally. CARDIOVASCULAR:  She has a grade 4/6 ejection systolic murmur in the left and right upper sternal edges, and point of maximal impulse is nondisplaced.  She does have a CABG scar, midline. ABDOMEN:  Soft, nontender.  No scars.  Moves equally with respiration. No rebound or guarding. EXTREMITIES:  Lower extremities have grade 2-3 pitting edema and the socks that she has on seemed tight and have caused indentations into his skin. SKIN:  Her skin was assessed and she does have some mild facial seborrheic keratosis on the left side of her face and age-associated skin changes. NEUROLOGIC:  Cranial nerves II through XII are grossly intact.  She is able to move all 4 limbs equally with 5/5 power.  Reflexes are 3/3 in the biceps, triceps, brachioradialis, knees and ankles.   Plantars are downgoing.  Coordination was assessed in bed and the patient was able to cooperate with the same.  She does not have any cerebellar signs, and no dysdiadochokinesia.  There is no pronator drift either.  Gait was not assessed at this time.  Workup done by the emergency room included a CT scan of the head, which showed diffuse atrophy and small vessel ischemic changes.  No evidence of acute intracranial hemorrhage, mass lesions, or acute infarct.  No significant change from prior.  Sodium 141, potassium 3.7, chloride  107, CO2 of 25, BUN and creatinine 29/1.10, calcium 8.7.  Her glucose is 102.  WBC is 12.8 with 80% relative neutrophilia, hemoglobin 12.5, hematocrit 37.9, platelets 217.  Chest x-ray, 2 view, showed cardiac enlargement, mild prominence of pulmonary vascularity.  No evidence of pulmonary edema or consolidation.  IMPRESSION/ASSESSMENT: 1. Weakness.  The patient is neurologically intact and has had a CT     scan.  I do not feel particularly compelled to get an MRI at this     point, given the fact that the patient does not have any focal     neurological deficits.  Nonetheless, I will try and attempt once     again to contact [name], the patient's  relative, to get the     full history as I am not sure if this facial drooping is secondary     to her history of TIA or not or stroke.  With this information in     mind, I will probably consult Neurology and I may get an MRI     depending on what is told me when I am able to question family     members.  I will get PT/OT to see the patient and clear the patient     for discharge.  It is unclear to me at this point in time whether     she is supposed to be on aspirin or any other medications given her     significant coronary artery disease issues and certainly she may     benefit at least from aspirin 81 mg and I will discuss this with     Neurology. 2. Questionable urinary tract infection.  The patient  had a urinalysis     done in the emergency room that was completely normal.  The patient     does not have a Foley but despite this, she might have some other     infectious process going on.  For now, we will hold off on any IV     antibiotics and place her on Bactrim DS 1 tablet b.i.d. for 4 days.     If she has a normal white count tomorrow, we may even consider     stopping this as this may be asymptomatic bacteria.  Her vitals are     stable, and she has no evidences of any systemic infectious     process. 3. Significant history of coronary artery disease.  We will cycle     troponins and cardiac enzymes x3.  EKG done today shows normal     sinus rhythm with slight bradycardia at about 60, PR interval of     0.12, QRS axis of 45 degrees, no acute T-wave inversions; however,     there do appear to be some T-wave inversions in V4, V5, and V6,     which are contiguous.  In V2, there is J-point elevation.  Compared     to an EKG to April 11, 2011, these T-wave changes are unchanged and I     do not appreciate any Q-waves.  We will hold off on speaking with     Cardiology, given the fact that these are not new findings. 4. History of aortic valve repair.  Again, the patient may need to be     on some type of anticoagulation.  Although this is not a mechanical     valve, she certainly would benefit from being on aspirin. 5. Hyperlipidemia.  We will continue the patient's statin while in the     hospital. 6. Dizziness.  The patient will continue on her meclizine once     medications are verified with pharmacy. I spent over an hour in time discussing with the patient and admitting the patient to the floor.  I will attempt once again to contact family members to get the full story and her hospital course may change depending on evolving progress.          ______________________________ Pleas Koch, MD     JS/MEDQ  D:  09/06/2011  T:  09/06/2011  Job:  [id number]  cc:   Leonette Most Record,  MD  Electronically Signed by Pleas Koch MD on 09/06/2011 09:16:24 PM

## 2011-09-07 ENCOUNTER — Other Ambulatory Visit: Payer: Self-pay

## 2011-09-07 LAB — LIPID PANEL
Cholesterol: 124 mg/dL (ref 0–200)
Total CHOL/HDL Ratio: 2.4 RATIO
Triglycerides: 95 mg/dL (ref <150)
VLDL: 19 mg/dL (ref 0–40)

## 2011-09-07 LAB — CBC
MCH: 30.7 pg (ref 26.0–34.0)
Platelets: 220 10*3/uL (ref 150–400)
RBC: 4.53 MIL/uL (ref 3.87–5.11)
RDW: 14.1 % (ref 11.5–15.5)
WBC: 8.1 10*3/uL (ref 4.0–10.5)

## 2011-09-07 LAB — BASIC METABOLIC PANEL
CO2: 22 mEq/L (ref 19–32)
Calcium: 9.4 mg/dL (ref 8.4–10.5)
GFR calc Af Amer: 86 mL/min — ABNORMAL LOW (ref >90)
Sodium: 144 mEq/L (ref 135–145)

## 2011-09-07 LAB — GLUCOSE, CAPILLARY
Glucose-Capillary: 142 mg/dL — ABNORMAL HIGH (ref 70–99)
Glucose-Capillary: 85 mg/dL (ref 70–99)

## 2011-09-07 LAB — HOMOCYSTEINE: Homocysteine: 14.9 umol/L (ref 4.0–15.4)

## 2011-09-07 MED ORDER — MECLIZINE HCL 25 MG PO TABS
25.0000 mg | ORAL_TABLET | Freq: Three times a day (TID) | ORAL | Status: DC | PRN
  Filled 2011-09-07: qty 1

## 2011-09-07 MED ORDER — FELODIPINE ER 5 MG PO TB24
5.0000 mg | ORAL_TABLET | Freq: Every day | ORAL | Status: DC
  Administered 2011-09-08 – 2011-09-09 (×2): 5 mg via ORAL
  Filled 2011-09-07 (×3): qty 1

## 2011-09-07 MED ORDER — HYDRALAZINE HCL 20 MG/ML IJ SOLN
10.0000 mg | Freq: Four times a day (QID) | INTRAMUSCULAR | Status: DC | PRN
  Filled 2011-09-07: qty 0.5

## 2011-09-07 MED ORDER — CLOPIDOGREL BISULFATE 75 MG PO TABS
75.0000 mg | ORAL_TABLET | Freq: Every day | ORAL | Status: DC
  Administered 2011-09-08 – 2011-09-10 (×3): 75 mg via ORAL
  Filled 2011-09-07 (×2): qty 1

## 2011-09-07 MED ORDER — ROSUVASTATIN CALCIUM 20 MG PO TABS
20.0000 mg | ORAL_TABLET | Freq: Every day | ORAL | Status: DC
  Administered 2011-09-08 – 2011-09-09 (×2): 20 mg via ORAL
  Filled 2011-09-07 (×4): qty 1

## 2011-09-07 MED ORDER — LISINOPRIL 20 MG PO TABS
20.0000 mg | ORAL_TABLET | Freq: Every day | ORAL | Status: DC
  Administered 2011-09-08 – 2011-09-10 (×3): 20 mg via ORAL
  Filled 2011-09-07 (×4): qty 1

## 2011-09-07 MED ORDER — MORPHINE SULFATE 2 MG/ML IJ SOLN: 2.0000 mg | INTRAMUSCULAR | Status: DC | PRN

## 2011-09-07 MED ORDER — HYDROCODONE-ACETAMINOPHEN 5-325 MG PO TABS: 1.5000 | ORAL_TABLET | Freq: Three times a day (TID) | ORAL | Status: DC | PRN

## 2011-09-07 MED ORDER — DIAZEPAM 5 MG PO TABS
5.0000 mg | ORAL_TABLET | Freq: Two times a day (BID) | ORAL | Status: DC | PRN
  Administered 2011-09-08 – 2011-09-09 (×2): 5 mg via ORAL
  Filled 2011-09-07: qty 1

## 2011-09-07 MED ORDER — GABAPENTIN 300 MG PO CAPS
300.0000 mg | ORAL_CAPSULE | Freq: Three times a day (TID) | ORAL | Status: DC
  Administered 2011-09-07 – 2011-09-10 (×8): 300 mg via ORAL
  Filled 2011-09-07 (×11): qty 1

## 2011-09-08 DIAGNOSIS — I6501 Occlusion and stenosis of right vertebral artery: Secondary | ICD-10-CM | POA: Diagnosis present

## 2011-09-08 DIAGNOSIS — Z951 Presence of aortocoronary bypass graft: Secondary | ICD-10-CM

## 2011-09-08 DIAGNOSIS — I10 Essential (primary) hypertension: Secondary | ICD-10-CM

## 2011-09-08 DIAGNOSIS — R739 Hyperglycemia, unspecified: Secondary | ICD-10-CM | POA: Diagnosis present

## 2011-09-08 DIAGNOSIS — R42 Dizziness and giddiness: Secondary | ICD-10-CM | POA: Diagnosis present

## 2011-09-08 DIAGNOSIS — G459 Transient cerebral ischemic attack, unspecified: Secondary | ICD-10-CM | POA: Diagnosis present

## 2011-09-08 DIAGNOSIS — E785 Hyperlipidemia, unspecified: Secondary | ICD-10-CM | POA: Diagnosis present

## 2011-09-08 LAB — GLUCOSE, CAPILLARY
Glucose-Capillary: 145 mg/dL — ABNORMAL HIGH (ref 70–99)
Glucose-Capillary: 117 mg/dL — ABNORMAL HIGH (ref 70–99)

## 2011-09-08 NOTE — Progress Notes (Signed)
CSW assessment and placement note completed. Milus Banister MSW,LCSW w/e Coverage [phone]

## 2011-09-08 NOTE — Progress Notes (Signed)
Subjective: Doing well no significant distress no complaints overnight no shortness of breath no chest pain no nausea no vomiting tolerating by mouth well has not passed stool. States categorically she does not really want to go to a nursing facility.   Objective: Weight change:   Intake/Output Summary (Last 24 hours) at 09/08/11 1346 Last data filed at 09/08/11 1100  Gross per 24 hour  Intake    260 ml  Output    150 ml  Net    110 ml   Filed Vitals:   09/08/11 0943  BP: 145/56  Pulse:   Temp:   Resp:      HEENT Arcus senilis no pallor no icterus poor dentition no JVD CHEST CTAP no added sound CARDS S1-S2 with no murmur rub or gallop, and Kayla Ramos has a grade 2/6 systolic murmur right upper sternal edge no arrhythmia noted regular rhythm NEURO cranial nerves II through XII grossly intact moves all 4 limbs equally slight drooping of right side of face of her smile does appear intact on smiling uvula is midline, power is 5 over 5 in all extremities SKIN she has keratotic atelectatic changes to the skin of her face she has no lower extremity edema she has no specific breakdown of the skin anywhere else  Lab Results: Results for orders placed during the hospital encounter of 09/06/11 (from the past 24 hour(s))  GLUCOSE, CAPILLARY     Status: Abnormal   Collection Time   09/07/11  4:48 PM      Component Value Range   Glucose-Capillary 125 (*) 70 - 99 (mg/dL)   Comment 1 Documented in Chart     Comment 2 Notify RN    GLUCOSE, CAPILLARY     Status: Normal   Collection Time   09/07/11 10:31 PM      Component Value Range   Glucose-Capillary 85  70 - 99 (mg/dL)  GLUCOSE, CAPILLARY     Status: Normal   Collection Time   09/08/11  7:04 AM      Component Value Range   Glucose-Capillary 97  70 - 99 (mg/dL)  GLUCOSE, CAPILLARY     Status: Abnormal   Collection Time   09/08/11 11:41 AM      Component Value Range   Glucose-Capillary 145 (*) 70 - 99 (mg/dL)   Comment 1 Notify RN     Comment 2 Documented in Chart       Micro Results: No results found for this or any previous visit (from the past 240 hour(s)).  Studies/Results: Dg Chest 2 View  09/06/2011  *RADIOLOGY REPORT*  Clinical Data: Weakness.  Hyperglycemia.  CHEST - 2 VIEW  Comparison: 04/11/2011  Findings: Stable postoperative changes in the mediastinum.  Cardiac enlargement with mild increased pulmonary vascularity.  No perihilar edema.  No blunting of costophrenic angles.  Linear atelectasis or fibrosis in the lung bases.  No focal airspace consolidation.  Calcified and tortuous aorta.  No significant changes since the previous study, allowing for differences in technique.  IMPRESSION: Cardiac enlargement with mild prominence of pulmonary vascularity. No evidence of pulmonary edema or consolidation.  Original Report Authenticated By: Marlon Pel, M.D.   Ct Head Wo Contrast  09/06/2011  *RADIOLOGY REPORT*  Clinical Data: Fatigue, weakness, dizziness, right facial droop.  CT HEAD WITHOUT CONTRAST  Technique:  Contiguous axial images were obtained from the base of the skull through the vertex without contrast.  Comparison: 04/11/2011  Findings: Diffuse cerebral atrophy.  Low attenuation change in  the deep white matter consistent with small vessel ischemic change.  No mass effect or midline shift.  No abnormal extra-axial fluid collections.  Ventricular dilatation consistent with central atrophy.  Gray-white matter junctions are distinct.  Basal cisterns are not effaced.  No evidence of acute intracranial hemorrhage. Stable appearance of the intracranial contents since the previous study.  Visualized paranasal sinuses are not opacified.  No depressed skull fractures.  Vascular calcifications in the intracranial arteries.  IMPRESSION: Diffuse atrophy and small vessel ischemic changes.  No evidence of acute intracranial hemorrhage, mass lesion, or acute infarct.  No significant change since previous study.  Original  Report Authenticated By: Marlon Pel, M.D.   Mr Angiogram Head Wo Contrast  09/06/2011  *RADIOLOGY REPORT*  Clinical Data:  Left brain stroke.  Dizziness.  MRI HEAD WITHOUT AND WITH CONTRAST MRA HEAD WITHOUT CONTRAST MRA NECK WITHOUT AND WITH CONTRAST  Technique:  Multiplanar, multiecho pulse sequences of the brain and surrounding structures were obtained without and with intravenous contrast.  Angiographic images of the Circle of Willis were obtained using MRA technique without intravenous contrast. Angiographic images of the neck were obtained using MRA technique without and with intravenous contrast.  Carotid stenosis measurements (when applicable) are obtained utilizing NASCET criteria, using the distal internal carotid diameter as the denominator.  Contrast: 15mL MULTIHANCE GADOBENATE DIMEGLUMINE 529 MG/ML IV SOLN .  Comparison:  head CT same day.  Multiple previous CT and MRI exams  MRI HEAD  Findings:  Diffusion imaging does not show any acute or subacute infarction.  There are chronic small vessel changes affecting the pons.  There are old small vessel infarctions affecting the cerebellum.  The cerebral hemispheres show chronic small vessel changes affecting the deep and subcortical white matter.  No cortical or large vessel territory infarction.  No mass lesion, hemorrhage, hydrocephalus or extra-axial collection.  No pituitary mass.  No inflammatory sinus disease.  No skull or skull base lesion.  IMPRESSION: No acute or reversible process.  Atrophy and chronic small vessel disease.  MRA HEAD  Findings: Both internal carotid arteries are widely patent into the brain.  The anterior and middle cerebral vessels are patent without proximal stenosis, aneurysm or vascular malformation.  Both vertebral arteries are patent with the left being dominant.  The basilar arteries patent without stenosis.  Posterior circulation branch vessels are patent.  The right PCA takes a fetal origin from the anterior  circulation.  IMPRESSION: Negative MR angiography of the large and medium-sized vessels.  No occlusion or correctable proximal stenosis.  MRA NECK  Findings: Branching pattern of brachiocephalic vessels from the arch is normal.  No origin stenoses.  Both common carotid arteries are widely patent to their respective bifurcation.  Both carotid bifurcations show mild atherosclerotic disease.  On the right, the diameter of the proximal internal carotid artery is 4 mm.  Compared to a more distal cervical ICA diameter of 5 mm, this indicates a 20% stenosis.  On the left, the diameters of the proximal internal carotid artery and more distal cervical internal carotid artery of both 5 mm, indicating no stenosis.  Vertebral artery origins are poorly seen because of motion artifact in the chest.  Beyond the origins, both vessels are widely patent through the cervical region.  IMPRESSION: No significant stenosis at either carotid bifurcation.  Original Report Authenticated By: Thomasenia Sales, M.D.   Mr Angiogram Neck W Wo Contrast  09/06/2011  *RADIOLOGY REPORT*  Clinical Data:  Left brain stroke.  Dizziness.  MRI HEAD WITHOUT AND WITH CONTRAST MRA HEAD WITHOUT CONTRAST MRA NECK WITHOUT AND WITH CONTRAST  Technique:  Multiplanar, multiecho pulse sequences of the brain and surrounding structures were obtained without and with intravenous contrast.  Angiographic images of the Circle of Willis were obtained using MRA technique without intravenous contrast. Angiographic images of the neck were obtained using MRA technique without and with intravenous contrast.  Carotid stenosis measurements (when applicable) are obtained utilizing NASCET criteria, using the distal internal carotid diameter as the denominator.  Contrast: 15mL MULTIHANCE GADOBENATE DIMEGLUMINE 529 MG/ML IV SOLN .  Comparison:  head CT same day.  Multiple previous CT and MRI exams  MRI HEAD  Findings:  Diffusion imaging does not show any acute or subacute  infarction.  There are chronic small vessel changes affecting the pons.  There are old small vessel infarctions affecting the cerebellum.  The cerebral hemispheres show chronic small vessel changes affecting the deep and subcortical white matter.  No cortical or large vessel territory infarction.  No mass lesion, hemorrhage, hydrocephalus or extra-axial collection.  No pituitary mass.  No inflammatory sinus disease.  No skull or skull base lesion.  IMPRESSION: No acute or reversible process.  Atrophy and chronic small vessel disease.  MRA HEAD  Findings: Both internal carotid arteries are widely patent into the brain.  The anterior and middle cerebral vessels are patent without proximal stenosis, aneurysm or vascular malformation.  Both vertebral arteries are patent with the left being dominant.  The basilar arteries patent without stenosis.  Posterior circulation branch vessels are patent.  The right PCA takes a fetal origin from the anterior circulation.  IMPRESSION: Negative MR angiography of the large and medium-sized vessels.  No occlusion or correctable proximal stenosis.  MRA NECK  Findings: Branching pattern of brachiocephalic vessels from the arch is normal.  No origin stenoses.  Both common carotid arteries are widely patent to their respective bifurcation.  Both carotid bifurcations show mild atherosclerotic disease.  On the right, the diameter of the proximal internal carotid artery is 4 mm.  Compared to a more distal cervical ICA diameter of 5 mm, this indicates a 20% stenosis.  On the left, the diameters of the proximal internal carotid artery and more distal cervical internal carotid artery of both 5 mm, indicating no stenosis.  Vertebral artery origins are poorly seen because of motion artifact in the chest.  Beyond the origins, both vessels are widely patent through the cervical region.  IMPRESSION: No significant stenosis at either carotid bifurcation.  Original Report Authenticated By: Thomasenia Sales, M.D.   Mr Laqueta Jean Wo Contrast  09/06/2011  *RADIOLOGY REPORT*  Clinical Data:  Left brain stroke.  Dizziness.  MRI HEAD WITHOUT AND WITH CONTRAST MRA HEAD WITHOUT CONTRAST MRA NECK WITHOUT AND WITH CONTRAST  Technique:  Multiplanar, multiecho pulse sequences of the brain and surrounding structures were obtained without and with intravenous contrast.  Angiographic images of the Circle of Willis were obtained using MRA technique without intravenous contrast. Angiographic images of the neck were obtained using MRA technique without and with intravenous contrast.  Carotid stenosis measurements (when applicable) are obtained utilizing NASCET criteria, using the distal internal carotid diameter as the denominator.  Contrast: 15mL MULTIHANCE GADOBENATE DIMEGLUMINE 529 MG/ML IV SOLN .  Comparison:  head CT same day.  Multiple previous CT and MRI exams  MRI HEAD  Findings:  Diffusion imaging does not show any acute or subacute infarction.  There are chronic small vessel changes affecting the  pons.  There are old small vessel infarctions affecting the cerebellum.  The cerebral hemispheres show chronic small vessel changes affecting the deep and subcortical white matter.  No cortical or large vessel territory infarction.  No mass lesion, hemorrhage, hydrocephalus or extra-axial collection.  No pituitary mass.  No inflammatory sinus disease.  No skull or skull base lesion.  IMPRESSION: No acute or reversible process.  Atrophy and chronic small vessel disease.  MRA HEAD  Findings: Both internal carotid arteries are widely patent into the brain.  The anterior and middle cerebral vessels are patent without proximal stenosis, aneurysm or vascular malformation.  Both vertebral arteries are patent with the left being dominant.  The basilar arteries patent without stenosis.  Posterior circulation branch vessels are patent.  The right PCA takes a fetal origin from the anterior circulation.  IMPRESSION: Negative MR  angiography of the large and medium-sized vessels.  No occlusion or correctable proximal stenosis.  MRA NECK  Findings: Branching pattern of brachiocephalic vessels from the arch is normal.  No origin stenoses.  Both common carotid arteries are widely patent to their respective bifurcation.  Both carotid bifurcations show mild atherosclerotic disease.  On the right, the diameter of the proximal internal carotid artery is 4 mm.  Compared to a more distal cervical ICA diameter of 5 mm, this indicates a 20% stenosis.  On the left, the diameters of the proximal internal carotid artery and more distal cervical internal carotid artery of both 5 mm, indicating no stenosis.  Vertebral artery origins are poorly seen because of motion artifact in the chest.  Beyond the origins, both vessels are widely patent through the cervical region.  IMPRESSION: No significant stenosis at either carotid bifurcation.  Original Report Authenticated By: Thomasenia Sales, M.D.   Medications: Scheduled Meds:   . clopidogrel  75 mg Oral Q breakfast  . felodipine  5 mg Oral Daily  . gabapentin  300 mg Oral TID  . lisinopril  20 mg Oral Daily  . rosuvastatin  20 mg Oral q1800  . DISCONTD: sulfamethoxazole-trimethoprim  1 tablet Oral Q12H   Continuous Infusions:  PRN Meds:.diazepam, hydrALAZINE, HYDROcodone-acetaminophen, meclizine, morphine injection, morphine injection, ondansetron (ZOFRAN) IV, ondansetron, zolpidem  Assessment/Plan: Kayla Ramos Active Hospital Problem List: TIA (transient ischemic attack) (09/08/2011)   Assessment: Kayla Ramos likely has had a small TIA and neurology input is appreciated she should be on Plavix not aspirin and she is not for any surgical intervention for per Dr. Levada Dy, of the neurology service. He has reviewed her films and does not deemed that she would benefit from any further interventions such as stenting of the vertebral artery. She would be at high risk for any further interventions from his  standpoint.  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  Lisinopril to continue  S/P Aortic valve repair (09/08/2011)   Assessment: This is a stable hospital problem. 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  He has had MRI and CT scan as well as carotid Dopplers which have not shown any other deficiency  Vertigo (09/08/2011)   Assessment: We will continue her meclizine for this issue.  Hyperlipidemia (09/08/2011)   Assessment: This is a stable medical issue and her lipid panel at this admission showed Results for Kayla, Kayla Ramos (MRN [id number]) as of 09/08/2011 14:01  Ref. Range 09/07/2011  06:30  Cholesterol Latest Range: 0-200 mg/dL 578  Triglycerides Latest Range: <150 mg/dL 95  HDL Latest Range: >46 mg/dL 52  LDL (calc) Latest Range: 0-99 mg/dL 53  VLDL Latest Range: 0-40 mg/dL 19  Total CHOL/HDL Ratio No range found 2.4   Hyperglycemia (09/08/2011)   Assessment: A1c on this admission was 5.7 she has hyperglycemia without diagnosis of diabetes mellitus. She will likely not benefit from being on that he glycemic agents as she has an increased mortality at the age that she sat up above 82.  HTN (hypertension) (09/08/2011)   Assessment: Stable hospital problem continue lisinopril blood pressures have been adequately controlled while here   I had an extensive discussion with Kayla Ramos with regards to disposition. Kayla Ramos does not wish to the nursing facility however I do not think that this will meet inpatient criteria for rehabilitation at [facility]. I have recommended to her that I will discuss with social worker further disposition plans and social worker at was contacted regarding the same. Kayla Ramos slightly disposition Will be discharged home in the morning with with maximal therapy  modalities versus short-term SNIF placement. Family member was understanding of the plan of care    LOS: 2 days   Neko Boyajian,JAI 09/08/2011, 1:46 PM

## 2011-09-09 ENCOUNTER — Inpatient Hospital Stay (HOSPITAL_COMMUNITY): Payer: Medicare Other

## 2011-09-09 LAB — GLUCOSE, CAPILLARY
Glucose-Capillary: 85 mg/dL (ref 70–99)
Glucose-Capillary: 118 mg/dL — ABNORMAL HIGH (ref 70–99)

## 2011-09-09 MED ORDER — FELODIPINE ER 10 MG PO TB24
10.0000 mg | ORAL_TABLET | Freq: Every day | ORAL | Status: DC
  Administered 2011-09-10: 10 mg via ORAL
  Filled 2011-09-09: qty 1

## 2011-09-09 NOTE — Progress Notes (Signed)
CSW met with pt and pt's granddts. CSW provided family with bed offers. CSW explained the importance of choosing a SNF in order to get authorization from insurance. Family was understanding and agreed to tour facilities today. CSW explained SNF process, dc process, and CSW role. Family was appreciative of consult. CSW will continue to follow to assist with dc needs.

## 2011-09-09 NOTE — Procedures (Signed)
EEG done and pt tolerated well. PT rested comfortably through test.

## 2011-09-09 NOTE — Procedures (Signed)
EEG NUMBER:  REFERRING PHYSICIAN:  Hospitalist Team VI.  HISTORY:  An 77 year old female with right-sided weakness in his face and right facial droop.  MEDICATIONS:  Plavix, Plendil, Neurontin, Prinivil, Desyrel, Crestor.  CONDITIONS OF RECORDING:  This is a 16-channel EEG carried out with the patient in the drowsy and asleep states.  DESCRIPTION:  The background activity is dominated by theta and delta rhythms.  There is an absence of faster beta activity anteriorly which was consistent with drowse.  The patient goes into a light sleep with symmetrical sleep spindles, vertex with a sharp activity and irregular slow activity.  The patient spends a majority of the tracing in stage 2 sleep.  Hypoventilation was not performed.  Intermittent photic stimulation failed to elicit any change in the tracing.  IMPRESSION:  This is a normal, drowsy, and asleep EEG.  No epileptiform activity was noted.          ______________________________ Thana Farr, MD    [id number] D:  09/09/2011 18:04:11  T:  09/09/2011 20:18:58  Job #:  [id number]

## 2011-09-09 NOTE — Progress Notes (Signed)
CSW met with granddtr who stated she and patient agreed on [facility]. CSW called SNF who stated as soon as authorization is approved, patient can be admitted to facility. CSW called and left another message with insurance company regarding authorization. CSW will continue to follow.

## 2011-09-09 NOTE — Progress Notes (Signed)
  Subjective: 24 year lady with transient dizziness and leaning to one side which has improved. 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 She has mild baseline dementia and is fall risk hence has not been felt to  be a candidate for vertebral stenting or surgery.  Objective: Vital signs in last 24 hours: Temp:  [97.8 F (36.6 C)-98.7 F (37.1 C)] 98.3 F (36.8 C) (11/05 1104) Pulse Rate:  [52-78] 60  (11/05 1104) Resp:  [18-20] 20  (11/05 1104) BP: (104-164)/(52-85) 148/85 mmHg (11/05 1104) SpO2:  [92 %-98 %] 95 % (11/05 1104) Weight change:  Last BM Date: 09/06/11  Intake/Output from previous day: 11/04 0701 - 11/05 0700 In: 210 [P.O.:210] Out: -  Intake/Output this shift:     }Awake  Alert oriented x 3.Diminished short term memory, recall and attention. Decreased hearing bilaterally. Normal speech and language.eye movements full without nystagmus. Face symmetric. Tongue midline. Normal strength, tone, reflexes and coordination. Normal sensation. Gait deferred. Mild tremors bilateral hands.  Lab Results:  Basename 09/07/11 0630  WBC 8.1  HGB 13.9  HCT 41.3  PLT 220   BMET  Basename 09/07/11 0630  NA 144  K 3.3*  CL 109  CO2 22  GLUCOSE 139*  BUN 16  CREATININE 0.79  CALCIUM 9.4    Studies/Results:     MRI examination of the brain no acute infarct , MRA brain shows no large vessel stenosis and MRA neck shows bilateral vertebral ostial stenosis 2DEcho normal ejection fraction. Lipid profile normal.      Assessment/Plan: 30 year lady with transient speech and balance difficulties probably vertebrobasilar TIA with known bilateral moderate vertebral origin stenosis. Plan Agree with conservative medical management and she is not a candidate for vascular surgery or stenting. Maintain aggressive risk factor management and  antiplatelet therapy. Follow up as outpatient in 2 months.  LOS: 3 days   Tiandre Teall,PRAMODKUMAR P 09/09/2011, 2:05 PM

## 2011-09-09 NOTE — Progress Notes (Signed)
Subjective: Specific complaints sitting in bed grooming and combing her hair colored breakfast this morning no chest pain or shortness of breath no nausea no vomiting undulated about 8 feet to the doorway with physical therapy. Spoke with his physical therapist personally who recommended that patient go to short-term rehabilitation facility for 8-14 days for steadiness on her feet.  Objective: Weight change:   Intake/Output Summary (Last 24 hours) at 09/09/11 1119 Last data filed at 09/09/11 0600  Gross per 24 hour  Intake    150 ml  Output      0 ml  Net    150 ml    Filed Vitals:   09/09/11 1104  BP: 148/85  Pulse: 60  Temp: 98.3 F (36.8 C)  Resp: 20   Telemetry-NSR  HEENT Arcus senilis no pallor no icterus poor dentition no JVD  CHEST CTAP no added sound  CARDS S1-S2 with no murmur rub or gallop, and patient has a grade 2/6 systolic murmur right upper sternal edge no arrhythmia noted regular rhythm  NEURO cranial nerves II through XII grossly intact moves all 4 limbs equally slight drooping of right side of face of her smile does appear intact on smiling uvula is midline, power is 5 over 5 in all extremities  SKIN she has keratotic atelectatic changes to the skin of her face she has no lower extremity edema she has no specific breakdown of the skin anywhere else   Lab Results: Lab Results  Component Value Date   WBC 8.1 09/07/2011   HGB 13.9 09/07/2011   HCT 41.3 09/07/2011   MCV 91.2 09/07/2011   PLT 220 09/07/2011   Lab Results  Component Value Date   CREATININE 0.79 09/07/2011   BUN 16 09/07/2011   NA 144 09/07/2011   K 3.3* 09/07/2011   CL 109 09/07/2011   CO2 22 09/07/2011    Micro Results: No results found for this or any previous visit (from the past 240 hour(s)).    Medications: Scheduled Meds:   . clopidogrel  75 mg Oral Q breakfast  . felodipine  5 mg Oral Daily  . gabapentin  300 mg Oral TID  . lisinopril  20 mg Oral Daily  . rosuvastatin  20 mg Oral  q1800   Continuous Infusions:  PRN Meds:.diazepam, hydrALAZINE, HYDROcodone-acetaminophen, meclizine, morphine injection, morphine injection, ondansetron (ZOFRAN) IV, ondansetron, zolpidem  Assessment/Plan: Patient Active Hospital Problem List: TIA (transient ischemic attack) (09/08/2011) Assessment: Patient likely has had a small TIA and neurology input is appreciated she should be on Plavix not aspirin and she is not for any surgical intervention for per Dr. Levada Dy, of the neurology service. He has reviewed her films and does not deemed that she would benefit from any further interventions such as stenting of the vertebral artery. She would be at high risk for any further interventions from his standpoint.  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 Lisinopril to continue  S/P Aortic valve repair (09/08/2011) Assessment: This is a stable hospital problem. 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 He has had MRI and CT scan as well as carotid Dopplers which have not shown any other deficiency  Vertigo (09/08/2011) Assessment: We will continue her meclizine for this issue.  Hyperlipidemia (09/08/2011) Assessment: This is a stable medical issue and her lipid panel at this admission showed Results  for LATERIA, ALDERMAN (MRN [id number]) as of 09/08/2011 14:01   Ref. Range  09/07/2011 06:30   Cholesterol  Latest Range: 0-200 mg/dL  841   Triglycerides  Latest Range: <150 mg/dL  95   HDL  Latest Range: >39 mg/dL  52   LDL (calc)  Latest Range: 3-24 mg/dL  53   VLDL  Latest Range: 0-40 mg/dL  19   Total CHOL/HDL Ratio  No range found  2.4    Hyperglycemia (09/08/2011) Assessment: A1c on this admission was 5.7 she has hyperglycemia without diagnosis of diabetes mellitus. She will likely not benefit from being on that he  glycemic agents as she has an increased mortality at the age that she sat up above 82. Blood sugar slightly low this morning will discontinue Lantus and discontinue checks  HTN (hypertension) (09/08/2011) Assessment: Stable hospital problem continue lisinopril blood pressures have been slightly high overnight would consider increasing lisinopril as an outpatient especially given that patient has hyperglycemia.  We'll place on hydralazine or non-nodal agents as I do not want to drop her blood pressure.  Have already alerted social worker with regards to [facility] search to discharge to a nursing facility tomorrow Will update family then.   LOS: 3 days   Min Tunnell,JAI 09/09/2011, 11:19 AM

## 2011-09-09 NOTE — Progress Notes (Signed)
CSW completed FL2 and faxed out to [facility] and [facility]. CSW placed completed FL2 in shadow chart. CSW sent authorization for [employer]. Patient will need authorization from insurance before being able to discharge. CSW will continue to follow.

## 2011-09-09 NOTE — Progress Notes (Signed)
Physical Therapy Treatment Patient Details Name: Kayla Ramos MRN: [id number] DOB: 12-16-1924 Today's Date: 09/09/2011  PT Assessment/Plan  PT - Assessment/Plan PT Plan: Discharge plan remains appropriate PT Frequency: Min 2X/week Follow Up Recommendations: Skilled nursing facility Equipment Recommended: Defer to next venue PT Goals  Acute Rehab PT Goals PT Goal: Rolling Supine to Right Side - Progress: Progressing toward goal PT Goal: Supine/Side to Sit - Progress: Progressing toward goal PT Transfer Goal: Sit to Stand/Stand to Sit - Progress: Progressing toward goal PT Goal: Ambulate - Progress: Progressing toward goal  PT Treatment Precautions/Restrictions    Mobility (including Balance) Bed Mobility Bed Mobility: Yes Rolling Right: 3: Mod assist;With rail Rolling Right Details (indicate cue type and reason): vc for technique, manual facilitation to fully rotate hips Right Sidelying to Sit: 2: Max assist;With rails;HOB elevated (comment degrees) (HOB 20) Right Sidelying to Sit Details (indicate cue type and reason): vc for technique Sitting - Scoot to Edge of Bed: 4: Min assist;With rail Sitting - Scoot to [employer] of Bed Details (indicate cue type and reason): incr time and effort; vc for technique--shifting laterally Transfers Transfers: Yes Sit to Stand: 3: Mod assist;With upper extremity assist;With armrests;From bed;From chair/3-in-1 Sit to Stand Details (indicate cue type and reason): repeated 6 times with emphasis on anterior wt-shift over BOS; progressed to min A for last 2 reps Stand to Sit: 3: Mod assist;With upper extremity assist;With armrests;To chair/3-in-1 Stand to Sit Details: as above Ambulation/Gait Ambulation/Gait: Yes Ambulation/Gait Assistance: 4: Min assist Ambulation/Gait Assistance Details (indicate cue type and reason): A for upright posture (pt kyphotic and with weak extensors); A to maneuver RW and keep closer to body Ambulation Distance (Feet): 25  Feet Assistive device: Rolling walker Gait Pattern: Step-to pattern;Decreased step length - right;Decreased step length - left;Shuffle;Trunk flexed    Exercise  General Exercises - Lower Extremity Ankle Circles/Pumps: AROM;Both;10 reps;Supine (also PROM stretching x 30 sec) Heel Slides: AROM;Both;10 reps;Supine End of Session PT - End of Session Equipment Utilized During Treatment: Gait belt Activity Tolerance: Patient limited by fatigue Patient left: in chair;with call bell in reach General Behavior During Session: [facility] for tasks performed Cognition: [facility] for tasks performed  Kayla Ramos 09/09/2011, 11:02 AM

## 2011-09-10 MED ORDER — CLOPIDOGREL BISULFATE 75 MG PO TABS: 75.0000 mg | ORAL_TABLET | Freq: Every day | ORAL | Status: DC

## 2011-09-10 MED ORDER — HYDROCODONE-ACETAMINOPHEN 7.5-325 MG PO TABS: 1.0000 | ORAL_TABLET | Freq: Three times a day (TID) | ORAL | Status: DC | PRN

## 2011-09-10 MED ORDER — ROSUVASTATIN CALCIUM 20 MG PO TABS: 20.0000 mg | ORAL_TABLET | Freq: Every day | ORAL | Status: DC

## 2011-09-10 MED ORDER — LISINOPRIL 20 MG PO TABS: 30.0000 mg | ORAL_TABLET | Freq: Every day | ORAL | Status: DC

## 2011-09-10 NOTE — Progress Notes (Signed)
Occupational Therapy Treatment Patient Details Name: Kayla Ramos MRN: [id number] DOB: Mar 31, 1925 Today's Date: 09/10/2011  OT Assessment/Plan OT Assessment/Plan Comments on Treatment Session: Pt is not safe to d/c home alone at this time, Rec SNF rehab (24 hour Min Assist & HHOT if goes home). Pt reports that she still drives OT Plan: Discharge plan remains appropriate;Discharge plan needs to be updated OT Frequency: Min 2X/week Follow Up Recommendations: Skilled nursing facility Equipment Recommended: Defer to next venue OT Goals    OT Treatment Precautions/Restrictions  Precautions Precautions: Fall   ADL ADL Grooming: Simulated;Minimal assistance;Wash/dry hands Grooming Details (indicate cue type and reason): Pt req VC's for safety w/ RW secondary to leaning forward on RW w/ elbows and knees flexed. Pt reports stiffness this AM Where Assessed - Grooming: Standing at sink Upper Body Bathing: Simulated;Minimal assistance Where Assessed - Upper Body Bathing: Sitting, chair Lower Body Bathing: Simulated;Maximal assistance Where Assessed - Lower Body Bathing: Sitting, chair Lower Body Dressing: Performed;Maximal assistance Lower Body Dressing Details (indicate cue type and reason): Don/Doff socks seated in chair (pt reports "stiffness" and also states independence PTA at home, per her report Where Assessed - Lower Body Dressing: Sitting, chair Toilet Transfer: Simulated;Minimal assistance Toilet Transfer Details (indicate cue type and reason): VC's for safety, sequencing, and posture Toilet Transfer Method: Ambulating Toilet Transfer Equipment: Comfort height toilet;Grab bars;Other (comment) (Rec 3;1 over toilet) Equipment Used: Rolling walker;Other (comment) (Gait belt) ADL Comments: Pt currently req Min A with increased time for sit-stand transfers; & Mod-max Assist bed mobility and LB ADL's: rec SNF for rehab after acute stay Mobility  Bed Mobility Rolling Right: 3: Mod  assist;With rail Right Sidelying to Sit: 2: Max assist;With rails;HOB elevated (comment degrees) Right Sidelying to Sit Details (indicate cue type and reason): HOB elevated about 25-30 degrees Sitting - Scoot to Edge of Bed: 4: Min assist Sitting - Scoot to [place] of Bed Details (indicate cue type and reason): VC's Transfers Transfers: Yes Sit to Stand: 3: Mod assist Sit to Stand Details (indicate cue type and reason): w/ consistent VC's for proper positioning and safety/sequencing Stand to Sit: 4: Min assist;With armrests Stand to Sit Details: VC's to reach for armrests (pt attempted to hold onto RW) Exercises Other Exercises Other Exercises: Pt performed functional mobility in room around bed and to chair; declined amb into BR secondary to weakness and deconditioning. Req cues posture  End of Session OT - End of Session Equipment Utilized During Treatment: Gait belt (RW) Activity Tolerance: Patient limited by fatigue;Other (comment) (general deconditioning) Patient left: in chair;with call bell in reach General Behavior During Session: [facility] for tasks performed Cognition: [facility] for tasks performed  Alm Bustard  09/10/2011, 11:18 AM

## 2011-09-10 NOTE — Discharge Summary (Signed)
[facility] Hospital Discharge Summary  Name: Kayla Ramos MRN: [id number] DOB: 07-25-1925 77 y.o. PCP:  No primary provider on file.  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  Consultations:   Neurohospitalist of Dr. Thad Ranger, and Dr.Sethi  Procedures Performed:  Dg Chest 2 View RT*  Clinical Data: Weakness.  Hyperglycemia.  CHEST - 2 VIEW  Comparison: 04/11/2011  Findings: Stable postoperative changes in the mediastinum.  Cardiac enlargement with mild increased pulmonary vascularity.  No perihilar edema.  No blunting of costophrenic angles.  Linear atele 09/06/2011  *RADIOLOGY REPOctasis or fibrosis in the lung bases.  No focal airspace consolidation.  Calcified and tortuous aorta.  No significant changes since the previous study, allowing for differences in technique.  IMPRESSION: Cardiac enlargement with mild prominence of pulmonary vascularity. No evidence of pulmonary edema or consolidation.  Original Report Authenticated By: Marlon Pel, M.D.   Ct Head Wo Contrast  09/06/2011  *RADIOLOGY REPORT*  Clinical Data: Fatigue, weakness, dizziness, right facial droop.  CT HEAD WITHOUT CONTRAST  Technique:  Contiguous axial images were obtained from the base of the skull through the vertex without contrast.  Comparison: 04/11/2011  Findings: Diffuse cerebral atrophy.  Low attenuation change in the deep white matter consistent with small vessel ischemic change.  No mass effect or midline shift.  No abnormal extra-axial fluid collections.  Ventricular dilatation  consistent with central atrophy.  Gray-white matter junctions are distinct.  Basal cisterns are not effaced.  No evidence of acute intracranial hemorrhage. Stable appearance of the intracranial contents since the previous study.  Visualized paranasal sinuses are not opacified.  No depressed skull fractures.  Vascular calcifications in the intracranial arteries.  IMPRESSION: Diffuse atrophy and small vessel ischemic changes.  No evidence of acute intracranial hemorrhage, mass lesion, or acute infarct.  No significant change since previous study.  Original Report Authenticated By: Marlon Pel, M.D.   Mr Angiogram Head Wo Contrast  09/06/2011  *RADIOLOGY REPORT*  Clinical Data:  Left brain stroke.  Dizziness.  MRI HEAD WITHOUT AND WITH CONTRAST MRA HEAD WITHOUT CONTRAST MRA NECK WITHOUT AND WITH CONTRAST  Technique:  Multiplanar, multiecho pulse sequences of the brain and surrounding structures were obtained without and with intravenous contrast.  Angiographic images of the Circle of Willis were obtained using MRA technique without intravenous contrast. Angiographic images of the neck were obtained using MRA technique without and with intravenous contrast.  Carotid stenosis measurements (when applicable) are obtained utilizing NASCET criteria, using the distal internal carotid diameter as the denominator.  Contrast: 15mL MULTIHANCE GADOBENATE DIMEGLUMINE 529 MG/ML IV SOLN .  Comparison:  head CT same day.  Multiple previous CT and MRI exams  MRI HEAD  Findings:  Diffusion imaging does not show any acute or subacute infarction.  There are chronic small vessel changes affecting the pons.  There are old small vessel infarctions affecting the cerebellum.  The cerebral hemispheres show chronic small vessel changes affecting the deep and subcortical white matter.  No cortical or large vessel territory infarction.  No mass lesion, hemorrhage, hydrocephalus or extra-axial collection.  No pituitary mass.  No  inflammatory sinus disease.  No skull or skull base lesion.  IMPRESSION: No acute or reversible process.  Atrophy and chronic small vessel disease.  MRA HEAD  Findings: Both internal carotid arteries are widely patent into the brain.  The anterior and middle cerebral vessels are patent without proximal stenosis, aneurysm or vascular malformation.  Both vertebral arteries are patent with the left being dominant.  The basilar arteries patent without stenosis.  Posterior circulation branch vessels are patent.  The right PCA takes a fetal origin from the anterior circulation.  IMPRESSION: Negative MR angiography of the large and medium-sized vessels.  No occlusion or correctable proximal stenosis.  MRA NECK  Findings: Branching pattern of brachiocephalic vessels from the arch is normal.  No origin stenoses.  Both common carotid arteries are widely patent to their respective bifurcation.  Both carotid bifurcations show mild atherosclerotic disease.  On the right, the diameter of the proximal internal carotid artery is 4 mm.  Compared to a more distal cervical ICA diameter of 5 mm, this indicates a 20% stenosis.  On the left, the diameters of the proximal internal carotid artery and more distal cervical internal carotid artery of both 5 mm, indicating no stenosis.  Vertebral artery origins are poorly seen because of motion artifact in the chest.  Beyond the origins, both vessels are widely patent through the cervical region.  IMPRESSION: No significant stenosis at either carotid bifurcation.  Original Report Authenticated By: Thomasenia Sales, M.D.   Mr Angiogram Neck W Wo Contrast  09/06/2011  *RADIOLOGY REPORT*  Clinical Data:  Left brain stroke.  Dizziness.  MRI HEAD WITHOUT AND WITH CONTRAST MRA HEAD WITHOUT CONTRAST MRA NECK WITHOUT AND WITH CONTRAST  Technique:  Multiplanar, multiecho pulse sequences of the brain and surrounding structures were obtained without and with intravenous contrast.  Angiographic images of  the Circle of Willis were obtained using MRA technique without intravenous contrast. Angiographic images of the neck were obtained using MRA technique without and with intravenous contrast.  Carotid stenosis measurements (when applicable) are obtained utilizing NASCET criteria, using the distal internal carotid diameter as the denominator.  Contrast: 15mL MULTIHANCE GADOBENATE DIMEGLUMINE 529 MG/ML IV SOLN .  Comparison:  head CT same day.  Multiple previous CT and MRI exams  MRI HEAD  Findings:  Diffusion imaging does not show any acute or subacute infarction.  There are chronic small vessel changes affecting the pons.  There are old small vessel infarctions affecting the cerebellum.  The cerebral hemispheres show chronic small vessel changes affecting the deep and subcortical white matter.  No cortical or large vessel territory infarction.  No mass lesion, hemorrhage, hydrocephalus or extra-axial collection.  No pituitary mass.  No inflammatory sinus disease.  No skull or skull base lesion.  IMPRESSION: No acute or reversible process.  Atrophy and chronic small vessel disease.  MRA HEAD  Findings: Both internal carotid arteries are widely patent into the brain.  The anterior and middle cerebral vessels are patent without proximal stenosis, aneurysm or vascular malformation.  Both vertebral arteries are patent with the left being dominant.  The basilar arteries patent without stenosis.  Posterior circulation branch vessels are patent.  The right PCA takes a fetal origin from the anterior circulation.  IMPRESSION: Negative MR angiography of the large and medium-sized vessels.  No occlusion or correctable proximal stenosis.  MRA NECK  Findings: Branching pattern of brachiocephalic vessels from the arch is normal.  No origin stenoses.  Both common carotid arteries are  widely patent to their respective bifurcation.  Both carotid bifurcations show mild atherosclerotic disease.  On the right, the diameter of the proximal  internal carotid artery is 4 mm.  Compared to a more distal cervical ICA diameter of 5 mm, this indicates a 20% stenosis.  On the left, the diameters of the proximal internal carotid artery and more distal cervical internal carotid artery of both 5 mm, indicating no stenosis.  Vertebral artery origins are poorly seen because of motion artifact in the chest.  Beyond the origins, both vessels are widely patent through the cervical region.  IMPRESSION: No significant stenosis at either carotid bifurcation.  Original Report Authenticated By: Thomasenia Sales, M.D.   Mr Laqueta Jean Wo Contrast  09/06/2011  *RADIOLOGY REPORT*  Clinical Data:  Left brain stroke.  Dizziness.  MRI HEAD WITHOUT AND WITH CONTRAST MRA HEAD WITHOUT CONTRAST MRA NECK WITHOUT AND WITH CONTRAST  Technique:  Multiplanar, multiecho pulse sequences of the brain and surrounding structures were obtained without and with intravenous contrast.  Angiographic images of the Circle of Willis were obtained using MRA technique without intravenous contrast. Angiographic images of the neck were obtained using MRA technique without and with intravenous contrast.  Carotid stenosis measurements (when applicable) are obtained utilizing NASCET criteria, using the distal internal carotid diameter as the denominator.  Contrast: 15mL MULTIHANCE GADOBENATE DIMEGLUMINE 529 MG/ML IV SOLN .  Comparison:  head CT same day.  Multiple previous CT and MRI exams  MRI HEAD  Findings:  Diffusion imaging does not show any acute or subacute infarction.  There are chronic small vessel changes affecting the pons.  There are old small vessel infarctions affecting the cerebellum.  The cerebral hemispheres show chronic small vessel changes affecting the deep and subcortical white matter.  No cortical or large vessel territory infarction.  No mass lesion, hemorrhage, hydrocephalus or extra-axial collection.  No pituitary mass.  No inflammatory sinus disease.  No skull or skull base lesion.   IMPRESSION: No acute or reversible process.  Atrophy and chronic small vessel disease.  MRA HEAD  Findings: Both internal carotid arteries are widely patent into the brain.  The anterior and middle cerebral vessels are patent without proximal stenosis, aneurysm or vascular malformation.  Both vertebral arteries are patent with the left being dominant.  The basilar arteries patent without stenosis.  Posterior circulation branch vessels are patent.  The right PCA takes a fetal origin from the anterior circulation.  IMPRESSION: Negative MR angiography of the large and medium-sized vessels.  No occlusion or correctable proximal stenosis.  MRA NECK  Findings: Branching pattern of brachiocephalic vessels from the arch is normal.  No origin stenoses.  Both common carotid arteries are widely patent to their respective bifurcation.  Both carotid bifurcations show mild atherosclerotic disease.  On the right, the diameter of the proximal internal carotid artery is 4 mm.  Compared to a more distal cervical ICA diameter of 5 mm, this indicates a 20% stenosis.  On the left, the diameters of the proximal internal carotid artery and more distal cervical internal carotid artery of both 5 mm, indicating no stenosis.  Vertebral artery origins are poorly seen because of motion artifact in the chest.  Beyond the origins, both vessels are widely patent through the cervical region.  IMPRESSION: No significant stenosis at either carotid bifurcation.  Original Report Authenticated By: Thomasenia Sales, M.D.    History-  See Admission H&P below  Admission HPI: The patient is a very pleasant 77 year old Caucasian  female who  presented to [facility], subsequent to being seen by her  granddaughter yesterday at 8 a.m. Per the patient, she states that she  usually is able to get up out of bed from a sitting position, however,  has not been able to do so for the past year. Her granddaughter came to  the house and because the  patient was not able to get out of bed from a  recumbent laying position, it is noted that the patient was brought over  to the [facility] because there was some noted weakness and  concerned for slight increase in right-sided facial droop. She denies  any specific weakness on any one-sided body, any seizure-like symptoms,  any loss of continence, any chest pain. She denies dysuria, but does  have frequency of urination, which is not new. She denies any fever.  Denies any productive sputum or phlegm or any current dizziness, but has  had in the past dizziness with change in position of her head.  She also states that she has had some leg swelling about the past month,  which is unexplained. Specifically asked, she denies any paroxysmal  nocturnal dyspnea or any orthopnea. She has not had any increase in  pillow use in the recent past and cannot explain the swelling in her  lower extremities.  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    Hospital Course by problem list: Patient Active Hospital Problem List: TIA (transient ischemic attack) (09/08/2011)   Assessment: H. was ultimately thought to have transient ischemic attack by neurology who did consult on the patient on numerous days appears imaging studies are done as per above which were noncontributory in terms of acute findings to cause a stroke. 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 They recommended that patient be on Plavix lifelong and did not think that patient would be a good operative candidate for surgical stenting of the area. Patient is to followup with Dr. Pennie Rushing D. in 2 months from now at his office and arrange for Supramid for followup I will CC this note to him S/P CABG x 3 (09/08/2011)   Assessment:  patient is very stable from the standpoint but was taking Anacin as mentioned above patient will need to be antiplatelet therapy as per instructions of neurologist    S/P mitral valve repair (09/08/2011)   Assessment: Is a stable hospital problem. Her echo report is as dictated  Impressions:   - Normal LV size with focal basal septal hypertrophy. EF     55-60%. Moderate diastolic dysfunction with evidence for     elevated LV filling pressure. Mild to moderate RV dilation     with normal systolic function. Moderate pulmonary     hypertension. At least moderate tricuspid regurgitation. 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 She will benefit from a short-term a short course rehabilitation for physical therapy and occupational therapy will see the patient and deemed the patient will benefit from short-term care     Hyperlipidemia (09/08/2011)   Assessment:  her LDL is actually optimally controlled at 53 and her total cholesterol is 124 during this admission she will continue on Crestor.    Hyperglycemia (09/08/2011)   Assessment: well controlled while in hospital would not recommend aggressive therapy for her blood sugar as there is  increasing mounting evidence that in A1c is below 7 in the elderly population to have increased mortality    HTN (hypertension) (09/08/2011)   Assessment:  Suboptimally controlled. Perhaps this is her main risk factor. I would recommend as an outpatient that the patient have her blood pressures checked aggressively and aggressively managed. I would recommend in her specific case that she either restart on a calcium channel blocker versus a low-dose thiazide in addition to her lisinopril that has been prescribed for her.       LOS: 4 days     Discharge Vitals & PE:  BP 160/90  Pulse 57  Temp(Src) 97.6 F (36.4 C) (Oral)  Resp 18  Ht 5\' 2"  (1.575 m)  Wt 59.8 kg (131 lb 13.4 oz)  BMI 24.11 kg/m2  SpO2 93%  Region seen on day of discharge doing well had no specific complaints other than some mild neck pain and no other issues was somewhat ambivalent with PT and OT and had no other complaints other than mild neck pain . Chest-clinically clear no added sounds no tactile vocal fremitus and resonance, slight twisting of most of the right  Head and neck-no bruit no JVD arcus senilis temporal wasting no pallor no icterus   Chest S1-S2 grade 3/6 murmur right upper sternal edge regular rate rhythm  Abdomen soft nontender nondistended  And cranial nerves II through XII intact power 5 on 5 bilaterally gait within normal limits and patient relatively steady on her feet.reflexes 2/3  Discharge Labs:  Results for orders placed during the hospital encounter of 09/06/11 (from the past 24 hour(s))  GLUCOSE, CAPILLARY     Status: Abnormal   Collection Time   09/09/11  4:24 PM      Component Value Range   Glucose-Capillary 151 (*) 70 - 99 (mg/dL)  GLUCOSE, CAPILLARY     Status: Abnormal   Collection Time   09/09/11  9:41 PM      Component Value Range   Glucose-Capillary 118 (*) 70 - 99 (mg/dL)   Comment 1 Documented in Chart     Comment 2 Notify RN    GLUCOSE, CAPILLARY     Status: Abnormal    Collection Time   09/10/11 11:58 AM      Component Value Range   Glucose-Capillary 152 (*) 70 - 99 (mg/dL)    Disposition and follow-up:   Ms.Bruce L Keatley was discharged from Hospital in  excellent  condition.    Follow-up Appointments: Discharge Orders    Future Orders Please Complete By Expires   Diet - low sodium heart healthy      Increase activity slowly  Discharge instructions      Comments:   Follow with Primary Care physician at Nursing home Needs follow up   Call MD for:  temperature >100.4      Call MD for:  severe uncontrolled pain      Call MD for:  hives      Call MD for:  persistant dizziness or light-headedness      Call MD for:  extreme fatigue         Discharge Medications: Current Discharge Medication List    START taking these medications   Details  clopidogrel (PLAVIX) 75 MG tablet Take 1 tablet (75 mg total) by mouth daily with breakfast. Qty: 30 tablet, Refills: 12    rosuvastatin (CRESTOR) 20 MG tablet Take 1 tablet (20 mg total) by mouth daily at 6 PM. Qty: 30 tablet, Refills: 0      CONTINUE these medications which have CHANGED   Details  HYDROcodone-acetaminophen (NORCO) 7.5-325 MG per tablet Take 1 tablet by mouth every 8 (eight) hours as needed. For severe pain Qty: 30 tablet, Refills: 0    lisinopril (PRINIVIL,ZESTRIL) 20 MG tablet Take 1.5 tablets (30 mg total) by mouth daily. Qty: 30 tablet, Refills: 0      CONTINUE these medications which have NOT CHANGED   Details  acetaminophen (ANACIN AF) 500 MG tablet Take 1,000 mg by mouth daily as needed. For headache     atorvastatin (LIPITOR) 40 MG tablet Take 40 mg by mouth daily.      diazepam (VALIUM) 5 MG tablet Take 5 mg by mouth 2 (two) times daily as needed. For anxiety    felodipine (PLENDIL) 5 MG 24 hr tablet Take 5 mg by mouth daily.      gabapentin (NEURONTIN) 300 MG capsule Take 300 mg by mouth 3 (three) times daily. For chronic pain     meclizine (ANTIVERT) 25 MG  tablet Take 25 mg by mouth 3 (three) times daily as needed. For dizziness       STOP taking these medications     meloxicam (MOBIC) 7.5 MG tablet         Signed: Kyndel Egger,JAI 09/10/2011, 1:34 PM

## 2011-09-10 NOTE — Progress Notes (Signed)
CSW spoke with SNF [facility]) who received authorization from insurance. CSW called and left a message for granddtr to complete paperwork with SNF. CSW text paged MD that patient received authorization. CSW will continue to follow to assist with dc needs.

## 2011-09-10 NOTE — Progress Notes (Signed)
CSW faxed dc summary and medications to SNF. SNF agreeable to admission. CSW prepared dc packet. CSW informed RN, pt, and pt's family of dc. CSW coordinated transportation via [place]. CSW is signing off.

## 2011-09-12 LAB — GLUCOSE, CAPILLARY: Glucose-Capillary: 104 mg/dL — ABNORMAL HIGH (ref 70–99)

## 2011-10-04 ENCOUNTER — Encounter (HOSPITAL_COMMUNITY): Payer: Self-pay | Admitting: Emergency Medicine

## 2011-10-04 ENCOUNTER — Emergency Department (HOSPITAL_COMMUNITY): Payer: Medicare Other

## 2011-10-04 ENCOUNTER — Inpatient Hospital Stay (HOSPITAL_COMMUNITY)
Admission: EM | Admit: 2011-10-04 | Discharge: 2011-10-08 | DRG: 312 | Disposition: A | Discharge status: Skilled Nursing Facility | Payer: Medicare Other | Attending: Internal Medicine | Admitting: Internal Medicine

## 2011-10-04 DIAGNOSIS — R001 Bradycardia, unspecified: Secondary | ICD-10-CM

## 2011-10-04 DIAGNOSIS — I951 Orthostatic hypotension: Principal | ICD-10-CM | POA: Diagnosis present

## 2011-10-04 DIAGNOSIS — M419 Scoliosis, unspecified: Secondary | ICD-10-CM

## 2011-10-04 DIAGNOSIS — I498 Other specified cardiac arrhythmias: Secondary | ICD-10-CM | POA: Diagnosis present

## 2011-10-04 DIAGNOSIS — Z79899 Other long term (current) drug therapy: Secondary | ICD-10-CM

## 2011-10-04 DIAGNOSIS — N179 Acute kidney failure, unspecified: Secondary | ICD-10-CM

## 2011-10-04 DIAGNOSIS — R55 Syncope and collapse: Secondary | ICD-10-CM

## 2011-10-04 DIAGNOSIS — D638 Anemia in other chronic diseases classified elsewhere: Secondary | ICD-10-CM | POA: Diagnosis present

## 2011-10-04 DIAGNOSIS — R7309 Other abnormal glucose: Secondary | ICD-10-CM | POA: Diagnosis present

## 2011-10-04 DIAGNOSIS — E785 Hyperlipidemia, unspecified: Secondary | ICD-10-CM | POA: Diagnosis present

## 2011-10-04 DIAGNOSIS — G8929 Other chronic pain: Secondary | ICD-10-CM

## 2011-10-04 DIAGNOSIS — I959 Hypotension, unspecified: Secondary | ICD-10-CM | POA: Diagnosis present

## 2011-10-04 DIAGNOSIS — E876 Hypokalemia: Secondary | ICD-10-CM | POA: Diagnosis present

## 2011-10-04 DIAGNOSIS — I10 Essential (primary) hypertension: Secondary | ICD-10-CM

## 2011-10-04 DIAGNOSIS — Z9889 Other specified postprocedural states: Secondary | ICD-10-CM

## 2011-10-04 DIAGNOSIS — Z23 Encounter for immunization: Secondary | ICD-10-CM

## 2011-10-04 DIAGNOSIS — I6509 Occlusion and stenosis of unspecified vertebral artery: Secondary | ICD-10-CM

## 2011-10-04 DIAGNOSIS — R739 Hyperglycemia, unspecified: Secondary | ICD-10-CM | POA: Diagnosis present

## 2011-10-04 DIAGNOSIS — Z951 Presence of aortocoronary bypass graft: Secondary | ICD-10-CM

## 2011-10-04 DIAGNOSIS — I2510 Atherosclerotic heart disease of native coronary artery without angina pectoris: Secondary | ICD-10-CM

## 2011-10-04 DIAGNOSIS — Z7902 Long term (current) use of antithrombotics/antiplatelets: Secondary | ICD-10-CM

## 2011-10-04 HISTORY — DX: Occlusion and stenosis of unspecified vertebral artery: I65.09

## 2011-10-04 LAB — BASIC METABOLIC PANEL
Chloride: 98 mEq/L (ref 96–112)
GFR calc Af Amer: 19 mL/min — ABNORMAL LOW (ref >90)
Potassium: 3.2 mEq/L — ABNORMAL LOW (ref 3.5–5.1)

## 2011-10-04 LAB — URINALYSIS, ROUTINE W REFLEX MICROSCOPIC
Ketones, ur: NEGATIVE mg/dL
Leukocytes, UA: NEGATIVE
Nitrite: NEGATIVE
Specific Gravity, Urine: 1.008 (ref 1.005–1.030)
Urobilinogen, UA: 0.2 mg/dL (ref 0.0–1.0)
pH: 5 (ref 5.0–8.0)

## 2011-10-04 LAB — CBC
Platelets: 193 10*3/uL (ref 150–400)
RDW: 13.7 % (ref 11.5–15.5)
WBC: 8.3 10*3/uL (ref 4.0–10.5)

## 2011-10-04 MED ORDER — SODIUM CHLORIDE 0.9 % IV SOLN
Freq: Once | INTRAVENOUS | Status: AC
  Administered 2011-10-04: 1000 mL via INTRAVENOUS

## 2011-10-04 MED ORDER — SODIUM CHLORIDE 0.9 % IV BOLUS (SEPSIS): 1000.0000 mL | Freq: Once | INTRAVENOUS | Status: DC

## 2011-10-04 MED ORDER — SODIUM CHLORIDE 0.9 % IV BOLUS (SEPSIS)
1000.0000 mL | Freq: Once | INTRAVENOUS | Status: AC
  Administered 2011-10-04: 1000 mL via INTRAVENOUS

## 2011-10-04 NOTE — ED Notes (Signed)
Patient from [facility] staff states patient had a syncopal episode when having a bowel movement. #22 LH, CBG 124, BP 116/82 P80

## 2011-10-04 NOTE — ED Notes (Addendum)
EKG done upon arrival and given to Dr. Judd Lien

## 2011-10-04 NOTE — ED Notes (Signed)
Pt aware of needed void pt unable to void at this time

## 2011-10-04 NOTE — ED Provider Notes (Signed)
History     CSN: [id number] Arrival date & time: 10/04/2011  9:01 PM   First MD Initiated Contact with Patient 10/04/11 2114      Chief Complaint  Patient presents with  . Loss of Consciousness    with bradycardia    (Consider location/radiation/quality/duration/timing/severity/associated sxs/prior treatment) Patient is a 77 y.o. female presenting with syncope. The history is provided by the patient and the nursing home.  Loss of Consciousness This is a new problem. The current episode started today. Episode frequency: once. The problem has been rapidly improving. Pertinent negatives include no abdominal pain, chest pain, chills, coughing, fatigue, fever, headaches, nausea, numbness, rash, urinary symptoms, vertigo, vomiting or weakness. The symptoms are aggravated by nothing. She has tried nothing for the symptoms. The treatment provided no relief.    Past Medical History  Diagnosis Date  . Hypertension   . Vertigo   . CAD (coronary artery disease)   . Hyperlipidemia   . Chronic back pain   . Scoliosis     Past Surgical History  Procedure Date  . Coronary artery bypass graft   . Cholecystectomy   . Valve replacement     No family history on file.  History  Substance Use Topics  . Smoking status: Never Smoker   . Smokeless tobacco: Not on file  . Alcohol Use: No    OB History    Grav Para Term Preterm Abortions TAB SAB Ect Mult Living                  Review of Systems  Constitutional: Negative for fever, chills and fatigue.  Respiratory: Negative for cough and shortness of breath.   Cardiovascular: Positive for syncope. Negative for chest pain and palpitations.  Gastrointestinal: Negative for nausea, vomiting, abdominal pain, diarrhea and constipation.  Genitourinary: Negative for dysuria and frequency.  Musculoskeletal: Negative for back pain.  Skin: Negative for color change and rash.  Neurological: Positive for syncope. Negative for vertigo, weakness,  light-headedness, numbness and headaches.  Psychiatric/Behavioral: Negative for confusion.  All other systems reviewed and are negative.    Allergies  Review of patient's allergies indicates no known allergies.  Home Medications   Current Outpatient Rx  Name Route Sig Dispense Refill  . ACETAMINOPHEN 500 MG PO TABS Oral Take 1,000 mg by mouth daily as needed. For headache     . ATORVASTATIN CALCIUM 40 MG PO TABS Oral Take 40 mg by mouth daily.      Marland Kitchen BISACODYL 10 MG RE SUPP Rectal Place 10 mg rectally as needed. For constipation. If nit relieved by Milk of Mag, give Bisacodyl x1 dose.     Marland Kitchen CLOPIDOGREL BISULFATE 75 MG PO TABS Oral Take 75 mg by mouth daily with breakfast.      . DIAZEPAM 5 MG PO TABS Oral Take 5 mg by mouth 2 (two) times daily as needed. For anxiety    . FELODIPINE ER 5 MG PO TB24 Oral Take 5 mg by mouth daily.      Marland Kitchen GABAPENTIN 300 MG PO CAPS Oral Take 300 mg by mouth 3 (three) times daily.     Marland Kitchen HYDROCODONE-ACETAMINOPHEN 7.5-325 MG PO TABS Oral Take 1 tablet by mouth every 8 (eight) hours as needed. For pain     . LISINOPRIL 20 MG PO TABS Oral Take 30 mg by mouth daily.      Marland Kitchen MILK OF MAGNESIA PO Oral Take 30 mLs by mouth daily as needed. For constipation. If no BM  in 3 days give Milk of Mag x1 dose.     Marland Kitchen MECLIZINE HCL 25 MG PO TABS Oral Take 25 mg by mouth 3 (three) times daily as needed. For dizziness     . ROSUVASTATIN CALCIUM 20 MG PO TABS Oral Take 20 mg by mouth daily at 6 PM.      . RA SALINE ENEMA RE Rectal Place 1 enema rectally daily as needed. For constipation. If not relieved by Bisacodyl, give enema x1 dose. If no results from enema, contact MD.       BP 81/54  Pulse 45  Temp 97.7 F (36.5 C)  Resp 16  SpO2 98%  Physical Exam  Nursing note and vitals reviewed. Constitutional: She is oriented to person, place, and time. She appears well-developed and well-nourished.  HENT:  Head: Normocephalic and atraumatic.  Eyes: Pupils are equal, round, and  reactive to light.  Neck:       No neck tenderness  Cardiovascular: Regular rhythm, normal heart sounds and intact distal pulses.  Bradycardia present.   Pulmonary/Chest: Effort normal and breath sounds normal. No respiratory distress. She exhibits no tenderness.  Abdominal: Soft. She exhibits no distension. There is no tenderness.  Musculoskeletal: She exhibits no tenderness.  Neurological: She is alert and oriented to person, place, and time.  Skin: Skin is warm and dry.  Psychiatric: She has a normal mood and affect.    ED Course  Procedures (including critical care time)  Labs Reviewed  BASIC METABOLIC PANEL - Abnormal; Notable for the following:    Potassium 3.2 (*)    Glucose, Bld 127 (*)    BUN 29 (*)    Creatinine, Ser 2.49 (*)    Calcium 8.0 (*)    GFR calc non Af Amer 17 (*)    GFR calc Af Amer 19 (*)    All other components within normal limits  URINALYSIS, ROUTINE W REFLEX MICROSCOPIC - Abnormal; Notable for the following:    APPearance CLOUDY (*)    All other components within normal limits  CBC  POCT I-STAT TROPONIN I  I-STAT TROPONIN I   Dg Chest 2 View  10/04/2011  *RADIOLOGY REPORT*  Clinical Data: Syncope; bradycardia.  CHEST - 2 VIEW  Comparison: Chest radiograph performed 09/06/2011  Findings: The lungs are well-aerated.  Mild bibasilar opacities likely reflect atelectasis.  Mild left midlung scarring is noted. Previously noted vascular congestion has improved.  There is no evidence of pleural effusion or pneumothorax.  The heart is mildly enlarged; the patient is status post median sternotomy, with evidence of prior CABG.  Calcification is noted within the aortic arch.  No acute osseous abnormalities are seen.  IMPRESSION:  1.  Mild bibasilar airspace opacities likely reflect atelectasis; interval improvement in vascular congestion. 2.  Mild cardiomegaly noted.  Original Report Authenticated By: Tonia Ghent, M.D.    Date: 10/04/2011  Rate: 51  Rhythm:  sinus bradycardia  QRS Axis: right  Intervals: normal  ST/T Wave abnormalities: ST depressions inferiorly and ST depressions laterally  Conduction Disutrbances:none  Narrative Interpretation: sinus bradycardia, mild ST depressions in leavs I, III, aVF,V6, with TWI in III, aVF, which are unchanged from prior  Old EKG Reviewed: unchanged (09/07/11)   1. Syncope   2. Bradycardia   3. Hypotension   4. Acute renal failure       MDM  77 yo F presents with episode of syncope at [facility]. Per staff, pt sat down on toilet and passed out; was unresponsive  for ~3 minutes, with very weak pulses and shallow respirations; no fall or injuries obtained. Pt states she remembers walking to the bathroom, but does not remember what happened after that. Denies any complaints either prior to or after event, including presyncope, CP, palpitations, SOB, weakness, infectious symptoms. Pt found to be hypotensive and bradycardic on arrival, exam otherwise unremarkable. Will check EKG, CXR, labs, to evaluate etiology of syncope and current hypotension/bradycardia.  Pt with acute renal failure- trop 2.49, at beginning of month was 0.8. Labs otherwise unremarkable; trop not elevation, no UTI on UA. Will give additional 1 L of NS due to ARF, and consult medicine for admission. Pt to go to step down unit for further management.       Theotis Burrow, MD 10/05/11 0020

## 2011-10-05 ENCOUNTER — Encounter (HOSPITAL_COMMUNITY): Payer: Self-pay | Admitting: Internal Medicine

## 2011-10-05 ENCOUNTER — Other Ambulatory Visit: Payer: Self-pay

## 2011-10-05 DIAGNOSIS — I959 Hypotension, unspecified: Secondary | ICD-10-CM | POA: Diagnosis present

## 2011-10-05 DIAGNOSIS — M419 Scoliosis, unspecified: Secondary | ICD-10-CM | POA: Insufficient documentation

## 2011-10-05 DIAGNOSIS — G8929 Other chronic pain: Secondary | ICD-10-CM | POA: Insufficient documentation

## 2011-10-05 DIAGNOSIS — Z9889 Other specified postprocedural states: Secondary | ICD-10-CM

## 2011-10-05 DIAGNOSIS — I2510 Atherosclerotic heart disease of native coronary artery without angina pectoris: Secondary | ICD-10-CM | POA: Diagnosis present

## 2011-10-05 DIAGNOSIS — I6509 Occlusion and stenosis of unspecified vertebral artery: Secondary | ICD-10-CM | POA: Insufficient documentation

## 2011-10-05 DIAGNOSIS — M549 Dorsalgia, unspecified: Secondary | ICD-10-CM | POA: Insufficient documentation

## 2011-10-05 DIAGNOSIS — I10 Essential (primary) hypertension: Secondary | ICD-10-CM | POA: Diagnosis present

## 2011-10-05 DIAGNOSIS — R001 Bradycardia, unspecified: Secondary | ICD-10-CM | POA: Diagnosis present

## 2011-10-05 DIAGNOSIS — R55 Syncope and collapse: Secondary | ICD-10-CM | POA: Diagnosis present

## 2011-10-05 LAB — BASIC METABOLIC PANEL
CO2: 24 mEq/L (ref 19–32)
Chloride: 108 mEq/L (ref 96–112)
Glucose, Bld: 132 mg/dL — ABNORMAL HIGH (ref 70–99)
Potassium: 3.6 mEq/L (ref 3.5–5.1)
Sodium: 142 mEq/L (ref 135–145)

## 2011-10-05 LAB — CARDIAC PANEL(CRET KIN+CKTOT+MB+TROPI)
Relative Index: INVALID (ref 0.0–2.5)
Total CK: 41 U/L (ref 7–177)
Troponin I: <0.3 ng/mL (ref <0.30)
Troponin I: <0.3 ng/mL (ref <0.30)

## 2011-10-05 MED ORDER — ONDANSETRON HCL 4 MG/2ML IJ SOLN
4.0000 mg | Freq: Four times a day (QID) | INTRAMUSCULAR | Status: DC | PRN
  Administered 2011-10-07 – 2011-10-08 (×2): 4 mg via INTRAVENOUS
  Filled 2011-10-05 (×2): qty 2

## 2011-10-05 MED ORDER — DOCUSATE SODIUM 100 MG PO CAPS
100.0000 mg | ORAL_CAPSULE | Freq: Two times a day (BID) | ORAL | Status: DC
  Administered 2011-10-05 – 2011-10-08 (×6): 100 mg via ORAL
  Filled 2011-10-05 (×7): qty 1

## 2011-10-05 MED ORDER — HEPARIN SODIUM (PORCINE) 5000 UNIT/ML IJ SOLN
5000.0000 [IU] | Freq: Three times a day (TID) | INTRAMUSCULAR | Status: DC
  Administered 2011-10-05 – 2011-10-08 (×10): 5000 [IU] via SUBCUTANEOUS
  Filled 2011-10-05 (×12): qty 1

## 2011-10-05 MED ORDER — ROSUVASTATIN CALCIUM 20 MG PO TABS
20.0000 mg | ORAL_TABLET | Freq: Every day | ORAL | Status: DC
  Administered 2011-10-05 – 2011-10-08 (×4): 20 mg via ORAL
  Filled 2011-10-05 (×4): qty 1

## 2011-10-05 MED ORDER — ACETAMINOPHEN 650 MG RE SUPP: 650.0000 mg | Freq: Four times a day (QID) | RECTAL | Status: DC | PRN

## 2011-10-05 MED ORDER — POTASSIUM CHLORIDE CRYS ER 20 MEQ PO TBCR
40.0000 meq | EXTENDED_RELEASE_TABLET | Freq: Two times a day (BID) | ORAL | Status: AC
  Administered 2011-10-05 (×2): 40 meq via ORAL
  Filled 2011-10-05 (×2): qty 2

## 2011-10-05 MED ORDER — SENNA 8.6 MG PO TABS
1.0000 | ORAL_TABLET | Freq: Two times a day (BID) | ORAL | Status: DC
  Administered 2011-10-05 – 2011-10-08 (×5): 8.6 mg via ORAL
  Filled 2011-10-05 (×8): qty 1

## 2011-10-05 MED ORDER — CLOPIDOGREL BISULFATE 75 MG PO TABS
75.0000 mg | ORAL_TABLET | Freq: Every day | ORAL | Status: DC
  Administered 2011-10-05 – 2011-10-08 (×4): 75 mg via ORAL
  Filled 2011-10-05 (×5): qty 1

## 2011-10-05 MED ORDER — ONDANSETRON HCL 4 MG PO TABS
4.0000 mg | ORAL_TABLET | Freq: Four times a day (QID) | ORAL | Status: DC | PRN
  Administered 2011-10-08: 4 mg via ORAL
  Filled 2011-10-05: qty 1

## 2011-10-05 MED ORDER — SODIUM CHLORIDE 0.9 % IV SOLN
INTRAVENOUS | Status: DC
  Administered 2011-10-05 – 2011-10-08 (×6): via INTRAVENOUS

## 2011-10-05 MED ORDER — ACETAMINOPHEN 325 MG PO TABS
650.0000 mg | ORAL_TABLET | Freq: Four times a day (QID) | ORAL | Status: DC | PRN
  Administered 2011-10-06 – 2011-10-08 (×8): 650 mg via ORAL
  Filled 2011-10-05 (×8): qty 2

## 2011-10-05 NOTE — Progress Notes (Signed)
Subjective: Feel better. Want to know when i can be released.  Objective: Vital signs in last 24 hours: Filed Vitals:   10/05/11 0612 10/05/11 0615 10/05/11 0616 10/05/11 1455  BP: 121/69 134/61 100/60 170/73  Pulse: 58 63 64 67  Temp: 96.9 F (36.1 C)   98.3 F (36.8 C)  TempSrc: Oral     Resp: 18     Height: 5\' 2"  (1.575 m)     Weight: 56.8 kg (125 lb 3.5 oz)     SpO2: 94%   96%   Weight change:   Intake/Output Summary (Last 24 hours) at 10/05/11 1502 Last data filed at 10/05/11 0600  Gross per 24 hour  Intake   2000 ml  Output    700 ml  Net   1300 ml   Physical Exam:   General Appearance:    Alert, cooperative, no distress, appears stated age, fragile looking and pleasant.   Lungs:     Clear to auscultation bilaterally, respirations unlabored   Heart:    Regular rate and rhythm, S1 and S2 normal   Abdomen:     Soft, non-tender, bowel sounds active all four quadrants,      Extremities:   Extremities normal, atraumatic, no cyanosis or edema  Pulses:   2+ and symmetric all extremities     Neurologic:   Alert and oriented and walked down the hallway twice without any issues.      Lab Results: Results for orders placed during the hospital encounter of 10/04/11 (from the past 24 hour(s))  CBC     Status: Normal   Collection Time   10/04/11  9:43 PM      Component Value Range   WBC 8.3  4.0 - 10.5 (K/uL)   RBC 3.94  3.87 - 5.11 (MIL/uL)   Hemoglobin 12.2  12.0 - 15.0 (g/dL)   HCT 16.1  09.6 - 04.5 (%)   MCV 91.9  78.0 - 100.0 (fL)   MCH 31.0  26.0 - 34.0 (pg)   MCHC 33.7  30.0 - 36.0 (g/dL)   RDW 40.9  81.1 - 91.4 (%)   Platelets 193  150 - 400 (K/uL)  BASIC METABOLIC PANEL     Status: Abnormal   Collection Time   10/04/11  9:43 PM      Component Value Range   Sodium 137  135 - 145 (mEq/L)   Potassium 3.2 (*) 3.5 - 5.1 (mEq/L)   Chloride 98  96 - 112 (mEq/L)   CO2 28  19 - 32 (mEq/L)   Glucose, Bld 127 (*) 70 - 99 (mg/dL)   BUN 29 (*) 6 - 23 (mg/dL)   Creatinine, Ser 7.82 (*) 0.50 - 1.10 (mg/dL)   Calcium 8.0 (*) 8.4 - 10.5 (mg/dL)   GFR calc non Af Amer 17 (*) >90 (mL/min)   GFR calc Af Amer 19 (*) >90 (mL/min)  URINALYSIS, ROUTINE W REFLEX MICROSCOPIC     Status: Abnormal   Collection Time   10/04/11 11:11 PM      Component Value Range   Color, Urine YELLOW  YELLOW    APPearance CLOUDY (*) CLEAR    Specific Gravity, Urine 1.008  1.005 - 1.030    pH 5.0  5.0 - 8.0    Glucose, UA NEGATIVE  NEGATIVE (mg/dL)   Hgb urine dipstick NEGATIVE  NEGATIVE    Bilirubin Urine NEGATIVE  NEGATIVE    Ketones, ur NEGATIVE  NEGATIVE (mg/dL)   Protein, ur NEGATIVE  NEGATIVE (  mg/dL)   Urobilinogen, UA 0.2  0.0 - 1.0 (mg/dL)   Nitrite NEGATIVE  NEGATIVE    Leukocytes, UA NEGATIVE  NEGATIVE   POCT I-STAT TROPONIN I     Status: Normal   Collection Time   10/04/11 11:47 PM      Component Value Range   Troponin i, poc 0.03  0.00 - 0.08 (ng/mL)   Comment 3           GLUCOSE, CAPILLARY     Status: Normal   Collection Time   10/05/11  7:21 AM      Component Value Range   Glucose-Capillary 93  70 - 99 (mg/dL)  CARDIAC PANEL(CRET KIN+CKTOT+MB+TROPI)     Status: Normal   Collection Time   10/05/11 10:00 AM      Component Value Range   Total CK 35  7 - 177 (U/L)   CK, MB 1.9  0.3 - 4.0 (ng/mL)   Troponin I <0.30  <0.30 (ng/mL)   Relative Index RELATIVE INDEX IS INVALID  0.0 - 2.5   . Micro Results: No results found for this or any previous visit (from the past 240 hour(s)). Studies/Results: Dg Chest 2 View  10/04/2011  *RADIOLOGY REPORT*  Clinical Data: Syncope; bradycardia.  CHEST - 2 VIEW  Comparison: Chest radiograph performed 09/06/2011  Findings: The lungs are well-aerated.  Mild bibasilar opacities likely reflect atelectasis.  Mild left midlung scarring is noted. Previously noted vascular congestion has improved.  There is no evidence of pleural effusion or pneumothorax.  The heart is mildly enlarged; the patient is status post median  sternotomy, with evidence of prior CABG.  Calcification is noted within the aortic arch.  No acute osseous abnormalities are seen.  IMPRESSION:  1.  Mild bibasilar airspace opacities likely reflect atelectasis; interval improvement in vascular congestion. 2.  Mild cardiomegaly noted.  Original Report Authenticated By: Tonia Ghent, M.D.   Medications: reviewed.  Scheduled Meds:   . sodium chloride   Intravenous Once  . clopidogrel  75 mg Oral Q breakfast  . docusate sodium  100 mg Oral BID  . heparin  5,000 Units Subcutaneous Q8H  . potassium chloride  40 mEq Oral BID  . rosuvastatin  20 mg Oral q1800  . senna  1 tablet Oral BID  . sodium chloride  1,000 mL Intravenous Once  . DISCONTD: sodium chloride  1,000 mL Intravenous Once   Continuous Infusions:   . sodium chloride 100 mL/hr at 10/05/11 0630   PRN Meds:.acetaminophen, acetaminophen, ondansetron (ZOFRAN) IV, ondansetron Assessment/Plan: Principal Problem:  *Syncope and collapse: secondary to orthostatic hypotension. On iv fluids. bp meds on hold. Has a recent echo without sig AS. Getting serial cardiac markers to r/oACS. Tele monitor.   Acute renal failure: most likely pre renal. On adequate hydration, will repeat labs later to day.  Bradycardia: resolved.  Pt /ot evaluation  Disposition: possibly on Monday, when medically stable.    LOS: 1 day   Secily Walthour 10/05/2011, 3:02 PM

## 2011-10-05 NOTE — Progress Notes (Signed)
Physical Therapy Evaluation Patient Details Name: Kayla Ramos MRN: [id number] DOB: [birth year] Today's Date: 10/05/2011  Problem List:  Patient Active Problem List  Diagnoses  . TIA (transient ischemic attack)  . S/P CABG x 3  . 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 Vertigo  . Hyperlipidemia  . Hyperglycemia  . HTN (hypertension)  . Syncope and collapse  . Bradycardia  . Hypotension  . S/P aortic valve repair  . Vertebral artery stenosis  . Scoliosis  . Chronic back pain  . CAD (coronary artery disease)  . Hypertension    Past Medical History:  Past Medical History  Diagnosis Date  . Hypertension   . Vertigo   . CAD (coronary artery disease) 2000    s/p CABG   . Hyperlipidemia   . Chronic back pain   . Scoliosis   . Vertebral artery stenosis 2006    75-90% per MR/MRA in 2006   . S/P aortic valve repair 2000   Past Surgical History:  Past Surgical History  Procedure Date  . Coronary artery bypass graft   . Cholecystectomy   . Valve replacement     PT Assessment/Plan/Recommendation PT Assessment Clinical Impression Statement: Pt. presenting after episode of syncope with generalized weakness and decreased activity tolerance and mobility.  Pt. able to ambulate in hall with no complaints of dizziness.  Would benefit from acute PT services to decrease burden of care upon return to SNF, especially with pt. being a poor historian and baseline functioning unknown.   PT Recommendation/Assessment: Patient will need skilled PT in the acute care venue PT Problem List: Decreased strength;Decreased activity tolerance;Decreased mobility;Decreased cognition;Decreased knowledge of use of DME;Decreased safety awareness Barriers to Discharge: None PT Therapy Diagnosis : Difficulty walking;Abnormality of gait;Generalized weakness PT Plan PT Frequency: Min 2X/week PT Treatment/Interventions: DME instruction;Gait training;Functional mobility training;Therapeutic  activities;Patient/family education PT Recommendation Follow Up Recommendations: Skilled nursing facility Equipment Recommended: Defer to next venue PT Goals  Acute Rehab PT Goals PT Goal Formulation: With patient Time For Goal Achievement: 2 weeks Pt will Roll Supine to Left Side: with modified independence;with rail PT Goal: Rolling Supine to Left Side - Progress: Other (comment) Pt will go Supine/Side to Sit: with min assist;with rail;with HOB not 0 degrees (comment degree) (15 degrees) PT Goal: Supine/Side to Sit - Progress: Other (comment) Pt will go Sit to Stand: with supervision PT Goal: Sit to Stand - Progress: Other (comment) Pt will go Stand to Sit: with supervision PT Goal: Stand to Sit - Progress: Other (comment) Pt will Transfer Bed to Chair/Chair to Bed: with supervision PT Transfer Goal: Bed to Chair/Chair to Bed - Progress: Other (comment) Pt will Ambulate: >150 feet;with supervision;with rolling walker PT Goal: Ambulate - Progress: Other (comment)  PT Evaluation Precautions/Restrictions  Precautions Precautions: Fall Prior Functioning  Home Living Lives With: Alone Type of Home: Skilled Nursing Facility Bathroom Shower/Tub: Other (comment) (Sponge bathes; staff sets-up otherwise independent) Bathroom Toilet: Standard Bathroom Accessibility: Yes How Accessible: Accessible via walker Home Adaptive Equipment: Bedside commode/3-in-1;Walker - rolling Prior Function Level of Independence: Needs assistance with tranfers;Needs assistance with homemaking;Needs assistance with gait;Needs assistance with ADLs (Supervision for ambulation with RW) Bath: Supervision/set-up Toileting: Supervision/set-up Meal Prep: Total Light Housekeeping: Maximal Vocation: Retired Financial risk analyst Arousal/Alertness: Awake/alert Overall Cognitive Status: Impaired Memory: Appears impaired Memory Deficits: Unable to give clear answers during history.  Problem Solving: Requires  assistance for problem solving Mobility (including Balance) Bed Mobility Bed Mobility: Yes Rolling Left: 4: Min assist;With rail  Rolling Left Details (indicate cue type and reason): Cues for sequence and for use of rails.  Left Sidelying to Sit: 3: Mod assist;HOB elevated (comment degrees) (15 degrees) Left Sidelying to Sit Details (indicate cue type and reason): Cues for sequence. Assist at shoulder and hip to rise to sitting.  Sitting - Scoot to Edge of Bed: 4: Min assist Sitting - Scoot to [place] of Bed Details (indicate cue type and reason): Cues for sequencing.  Transfers Transfers: Yes Sit to Stand: 4: Min assist;With upper extremity assist;From bed;From chair/3-in-1 Sit to Stand Details (indicate cue type and reason): Cues for hand placement.  Assist for anterior translation.  Stand to Sit: 4: Min assist;With armrests;To chair/3-in-1;To bed Stand to Sit Details: Cues for hand placement.  Pt. wanting to sit prematurely, assist for control of descent.  Ambulation/Gait Ambulation/Gait: Yes Ambulation/Gait Assistance: 4: Min assist Ambulation/Gait Assistance Details (indicate cue type and reason): Cues to stay inside RW and for upright posture.  Ambulation Distance (Feet): 170 Feet Assistive device: Rolling walker Gait Pattern: Step-through pattern;Decreased stride length;Trunk flexed      End of Session PT - End of Session Equipment Utilized During Treatment: Gait belt Activity Tolerance: Patient tolerated treatment well Patient left: in chair;with call bell in reach;Other (comment) (Physician present. ) Nurse Communication: Mobility status for transfers General Behavior During Session: [facility] for tasks performed Cognition: Impaired Cognitive Impairment: Short term memory deficits.   Laney Pastor, SPT 10/05/2011, 3:20 PM

## 2011-10-05 NOTE — Progress Notes (Signed)
Seen and agree with SPT note Dione Mccombie Tabor, PT [phone]  

## 2011-10-05 NOTE — H&P (Signed)
PCP:  Family Practice in [place] with [facility], per the family to ED staff (not here by my evaluation)  Chief Complaint:  Syncope  HPI:  86yoF with h/o CAD s/p CABG and aortic valve repair in 2000, right vertebral artery  stenosis, HTN, vertigo now presents with syncopal episode and found to be bradycardic  and hypotense, and in renal failure.   Pt is conversant but history is not as reliable, so gathered from ED staff and  documents from [place]. Pt had just gotten up to go to bathroom, sat down on toilet  with assistance and syncopal episode, unresponsive, not clear how long. Vitals  documented from [facility] show 97.0, p49-57, 111/56, RR 18. Noted to have weak pulse,  shallow respirations. By arrival, was awake and had no complaints since. She also had  no complaints before the incident either. On arrival   In the ED pt bradycardic to 42-57 and initially hypotensive as low as 70/40 however  improved quickly with IVF's, most recently 115/61. Chemistry panel showed renal failure  at 29/2.49 up from 16/0.79 a month ago, hypoK to 3.2, CBC normal, UA negative, Trop POC  negative x1. CXR with mild bibasilar atelectasis with interval improvement in vascular  congestion, and mild cardiomegaly.   2L have gone in, with a 3rd ordered, and BP has held stead for at least past couple  hours. Pt denies any symptoms at presents either before or after, says she was in usual  state of health without chest pain, SOB, dizziness, LH earlier today. No complaints at  present either. ROS o/w negative.   Of note, pt was just discahrged 09/10/2011. She was admitted because she was unable to  get out of bed and was noted to have some weakness and slight increase in right facial  droop. The patient was thought to have had a TIA by Neurology who consulted on her,  despite imaging studies not showing anything too acute, but based on h/o possible  vertebral artery stenosis (75-90% per previously  done MR/MRA?) this was thought maybe  the etiology.    Past Medical History  Diagnosis Date  . Hypertension   . Vertigo   . CAD (coronary artery disease) 2000    s/p CABG   . Hyperlipidemia   . Chronic back pain   . Scoliosis   . Vertebral artery stenosis 2006    75-90% per MR/MRA in 2006   . S/P aortic valve repair 2000    Past Surgical History  Procedure Date  . Coronary artery bypass graft   . Cholecystectomy   . Valve replacement     Medications:  HOME MEDS: Not reconcilable Prior to Admission medications   Medication Sig Start Date End Date Taking? Authorizing Provider  acetaminophen (TYLENOL) 500 MG tablet Take 1,000 mg by mouth daily as needed. For headache    Yes Historical Provider, MD  atorvastatin (LIPITOR) 40 MG tablet Take 40 mg by mouth daily.     Yes Historical Provider, MD  bisacodyl (DULCOLAX) 10 MG suppository Place 10 mg rectally as needed. For constipation. If nit relieved by Milk of Mag, give Bisacodyl x1 dose.    Yes Historical Provider, MD  clopidogrel (PLAVIX) 75 MG tablet Take 75 mg by mouth daily with breakfast.   09/10/11 09/09/12 Yes Pleas Koch, MD  diazepam (VALIUM) 5 MG tablet Take 5 mg by mouth 2 (two) times daily as needed. For anxiety   Yes Historical Provider, MD  felodipine (PLENDIL) 5 MG 24 hr  tablet Take 5 mg by mouth daily.     Yes Historical Provider, MD  gabapentin (NEURONTIN) 300 MG capsule Take 300 mg by mouth 3 (three) times daily.    Yes Historical Provider, MD  HYDROcodone-acetaminophen (NORCO) 7.5-325 MG per tablet Take 1 tablet by mouth every 8 (eight) hours as needed. For pain  09/10/11  Yes Pleas Koch, MD  lisinopril (PRINIVIL,ZESTRIL) 20 MG tablet Take 30 mg by mouth daily.   09/10/11  Yes Pleas Koch, MD  Magnesium Hydroxide (MILK OF MAGNESIA PO) Take 30 mLs by mouth daily as needed. For constipation. If no BM in 3 days give Milk of Mag x1 dose.    Yes Historical Provider, MD  meclizine (ANTIVERT) 25 MG tablet Take 25 mg by  mouth 3 (three) times daily as needed. For dizziness    Yes Historical Provider, MD  rosuvastatin (CRESTOR) 20 MG tablet Take 20 mg by mouth daily at 6 PM.   09/10/11 09/09/12 Yes Pleas Koch, MD  Sodium Phosphates (RA SALINE ENEMA RE) Place 1 enema rectally daily as needed. For constipation. If not relieved by Bisacodyl, give enema x1 dose. If no results from enema, contact MD.    Yes Historical Provider, MD    Allergies:  No Known Allergies  Social History:  Not reconcilable  reports that she has never smoked. She does not have any smokeless tobacco history on file. She reports that she does not drink alcohol or use illicit drugs.  Family History: Not reconcilable No family history on file.  Physical Exam: Filed Vitals:   10/05/11 0230 10/05/11 0238 10/05/11 0300 10/05/11 0330  BP: 115/61 115/61 127/91 130/50  Pulse: 56 44 66 54  Temp:      Resp: 18  22 17   SpO2: 97%  99% 99%   Blood pressure 130/50, pulse 54, temperature 97.7 F (36.5 C), resp. rate 17, SpO2 99.00%.   Gen: Elderly F, sleeping comfortably and awakens to voice, is conversant. No distress,  appears stable.  HEENT: PERRL at around 3-71mm, EOMI, sclera are clear. She is wearing lipstick but her  lips and tongue do appear very dry.  Lungs: R base with inspiratory crackle that cleared with repeated breaths, otherwise  lungs fairly CTAB  Heart: RRR, a bit brady, but has AS type murmur loudest at BUSB's and decreasing  towards apex, it has preserved S2  Abd: Soft, NT ND, benign Extrem: Warm, perfusing, not cool or cyanotic. No BLE edema.  Neuro: Awakens and converses with clear speech, oriented to being in the hospital and  can state that she was previously living at home but more recnetly is [facility] ([facility] at [facility]). She sits up in the bed when asked unassisted   Labs & Imaging Results for orders placed during the hospital encounter of 10/04/11 (from the past 48 hour(s))  CBC     Status:  Normal   Collection Time   10/04/11  9:43 PM      Component Value Range Comment   WBC 8.3  4.0 - 10.5 (K/uL)    RBC 3.94  3.87 - 5.11 (MIL/uL)    Hemoglobin 12.2  12.0 - 15.0 (g/dL)    HCT 16.1  09.6 - 04.5 (%)    MCV 91.9  78.0 - 100.0 (fL)    MCH 31.0  26.0 - 34.0 (pg)    MCHC 33.7  30.0 - 36.0 (g/dL)    RDW 40.9  81.1 - 91.4 (%)    Platelets 193  150 - 400 (K/uL)   BASIC METABOLIC PANEL     Status: Abnormal   Collection Time   10/04/11  9:43 PM      Component Value Range Comment   Sodium 137  135 - 145 (mEq/L)    Potassium 3.2 (*) 3.5 - 5.1 (mEq/L)    Chloride 98  96 - 112 (mEq/L)    CO2 28  19 - 32 (mEq/L)    Glucose, Bld 127 (*) 70 - 99 (mg/dL)    BUN 29 (*) 6 - 23 (mg/dL)    Creatinine, Ser 4.78 (*) 0.50 - 1.10 (mg/dL)    Calcium 8.0 (*) 8.4 - 10.5 (mg/dL)    GFR calc non Af Amer 17 (*) >90 (mL/min)    GFR calc Af Amer 19 (*) >90 (mL/min)   URINALYSIS, ROUTINE W REFLEX MICROSCOPIC     Status: Abnormal   Collection Time   10/04/11 11:11 PM      Component Value Range Comment   Color, Urine YELLOW  YELLOW     APPearance CLOUDY (*) CLEAR     Specific Gravity, Urine 1.008  1.005 - 1.030     pH 5.0  5.0 - 8.0     Glucose, UA NEGATIVE  NEGATIVE (mg/dL)    Hgb urine dipstick NEGATIVE  NEGATIVE     Bilirubin Urine NEGATIVE  NEGATIVE     Ketones, ur NEGATIVE  NEGATIVE (mg/dL)    Protein, ur NEGATIVE  NEGATIVE (mg/dL)    Urobilinogen, UA 0.2  0.0 - 1.0 (mg/dL)    Nitrite NEGATIVE  NEGATIVE     Leukocytes, UA NEGATIVE  NEGATIVE  MICROSCOPIC NOT DONE ON URINES WITH NEGATIVE PROTEIN, BLOOD, LEUKOCYTES, NITRITE, OR GLUCOSE <1000 mg/dL.  POCT I-STAT TROPONIN I     Status: Normal   Collection Time   10/04/11 11:47 PM      Component Value Range Comment   Troponin i, poc 0.03  0.00 - 0.08 (ng/mL)    Comment 3             Dg Chest 2 View  10/04/2011  *RADIOLOGY REPORT*  Clinical Data: Syncope; bradycardia.  CHEST - 2 VIEW  Comparison: Chest radiograph performed 09/06/2011   Findings: The lungs are well-aerated.  Mild bibasilar opacities likely reflect atelectasis.  Mild left midlung scarring is noted. Previously noted vascular congestion has improved.  There is no evidence of pleural effusion or pneumothorax.  The heart is mildly enlarged; the patient is status post median sternotomy, with evidence of prior CABG.  Calcification is noted within the aortic arch.  No acute osseous abnormalities are seen.  IMPRESSION:  1.  Mild bibasilar airspace opacities likely reflect atelectasis; interval improvement in vascular congestion. 2.  Mild cardiomegaly noted.  Original Report Authenticated By: Tonia Ghent, M.D.   ECG: sinus at 51 bpm, RAD, biatrial enlargement, normal PR interval, narrow QRS, but  with new appearing Q wave in aVL and loss of R height in I, also whereas previously  there was an RSR' in V2 there is now loss of R height and what appears to be frank Q  wave in V1-2 (V1 had Q before). In V2, there is sub-39mm ST elevation isolated to that  lead. There is TWI in V4-6 and more prominent TWI in inferior leads too   Echo 09/2011 normal LV size with focal basal septal hypertrophy, EF 55-60%, moderate  diastolic dysfxn with elevated LV filling pressure, mild-mod RV dilatation and normal  systolic fxn, moderate pulm HTN,  mod TR. No aortic stenosis  Impression Present on Admission:  .Syncope and collapse .Bradycardia .Hypotension .CAD (coronary artery disease) .Hypertension .Hyperlipidemia .Hyperglycemia  86yoF with h/o CAD s/p CABG and aortic valve repair in 2000, right vertebral artery  stenosis, HTN, vertigo now presents with syncopal episode and found to be bradycardic  and hypotense, and in renal failure.   1. Syncope and hypoTN: Suspect hypovolemia in setting of acute renal failure and rapid  improvement with IVF's, however in setting of bradycardia and EKG changes, also  concerning for cardiac ischemia. HypoTN now resolved, ? increased vagal tone.    Considered AS however pt has echo this month without significant AS, but did have focal  basal septal hypertrophy -- ? LV outlet obstruction? Finally, elderly patient on  antiHTN meds, valium -- consider medication effect.   - IVF's, orthostatics, PT consult - Trend BMET  - Holding Felodipine, Lisinopril   2. Bradycardia: with inferior lead changes, concerned for RCA lesion. Pt did not have any  symptoms in recent past or now. Trop negative x1.   - Rule out MI, trend ECG's, if not improving consult cardiology.  - Trend HR and keep on telemetry, no need for atropine at present.   3. Acute renal failure: Suspect hypovolemia. Pt endorses thirst, dry mouth, states she  didn't drink anything yesterday.   - UNa, UCr, Uosm for FeNa - IVF's and trend BMET's - Holding Lisinopril   4. CAD s/p CABG and AVR: continue Plavix, crestor (of note, med rec shows pt also on  lipitor?)  - Holding Lisinopril, Felodipine   5. Med rec: holding gabapentin, meclizine, BM regimen, Vicodin, valium   Pt originally triaged to SDU but will change to telemetry given BP stability, [facility] team 6 SubQ heparin  Unable to discuss code, but ppw from NH indicates full  Other plans as per orders.  Ewel Lona 10/05/2011, 4:11 AM

## 2011-10-05 NOTE — ED Provider Notes (Signed)
I saw and evaluated the patient, reviewed the resident's note and I agree with the findings and plan.  The patient arrived to the ER after an apparent syncopal/unresponsive episode at the ecf.  Exam revealed no focal abnormalities.  Workup revealed bradycardia, arf, and hypotension.  I agree with resident's interpretation of the ekg.  Will call medicine for admission.  Geoffery Lyons, MD 10/05/11 [phone]

## 2011-10-06 LAB — CBC
HCT: 35.6 % — ABNORMAL LOW (ref 36.0–46.0)
Hemoglobin: 11.9 g/dL — ABNORMAL LOW (ref 12.0–15.0)
WBC: 5.7 10*3/uL (ref 4.0–10.5)

## 2011-10-06 LAB — GLUCOSE, CAPILLARY
Glucose-Capillary: 93 mg/dL (ref 70–99)
Glucose-Capillary: 111 mg/dL — ABNORMAL HIGH (ref 70–99)

## 2011-10-06 LAB — CARDIAC PANEL(CRET KIN+CKTOT+MB+TROPI)
Relative Index: INVALID (ref 0.0–2.5)
Troponin I: <0.3 ng/mL (ref <0.30)

## 2011-10-06 LAB — BASIC METABOLIC PANEL
CO2: 26 mEq/L (ref 19–32)
Chloride: 108 mEq/L (ref 96–112)
GFR calc Af Amer: 29 mL/min — ABNORMAL LOW (ref >90)
Potassium: 3.7 mEq/L (ref 3.5–5.1)

## 2011-10-06 LAB — OSMOLALITY, URINE: Osmolality, Ur: 301 mOsm/kg — ABNORMAL LOW (ref 390–1090)

## 2011-10-06 MED ORDER — FELODIPINE ER 5 MG PO TB24
5.0000 mg | ORAL_TABLET | Freq: Every day | ORAL | Status: DC
  Administered 2011-10-06 – 2011-10-07 (×2): 5 mg via ORAL
  Filled 2011-10-06 (×3): qty 1

## 2011-10-06 NOTE — Progress Notes (Signed)
Subjective: No new complaints. Objective: Weight change: -0.3 kg (-10.6 oz)  Intake/Output Summary (Last 24 hours) at 10/06/11 1722 Last data filed at 10/06/11 1700  Gross per 24 hour  Intake    720 ml  Output   2301 ml  Net  -1581 ml  pt is alert afebrile, comfortable. cvs s1 and s2 heard Lungs clear Abdomen soft non tender, and bowel sounds heard Extremities no edema.   Lab Results: Results for orders placed during the hospital encounter of 10/04/11 (from the past 24 hour(s))  CARDIAC PANEL(CRET KIN+CKTOT+MB+TROPI)     Status: Normal   Collection Time   10/06/11 12:23 AM      Component Value Range   Total CK 37  7 - 177 (U/L)   CK, MB 2.2  0.3 - 4.0 (ng/mL)   Troponin I <0.30  <0.30 (ng/mL)   Relative Index RELATIVE INDEX IS INVALID  0.0 - 2.5   BASIC METABOLIC PANEL     Status: Abnormal   Collection Time   10/06/11 12:23 AM      Component Value Range   Sodium 142  135 - 145 (mEq/L)   Potassium 3.7  3.5 - 5.1 (mEq/L)   Chloride 108  96 - 112 (mEq/L)   CO2 26  19 - 32 (mEq/L)   Glucose, Bld 93  70 - 99 (mg/dL)   BUN 26 (*) 6 - 23 (mg/dL)   Creatinine, Ser 6.21 (*) 0.50 - 1.10 (mg/dL)   Calcium 7.9 (*) 8.4 - 10.5 (mg/dL)   GFR calc non Af Amer 25 (*) >90 (mL/min)   GFR calc Af Amer 29 (*) >90 (mL/min)  CBC     Status: Abnormal   Collection Time   10/06/11 12:23 AM      Component Value Range   WBC 5.7  4.0 - 10.5 (K/uL)   RBC 3.88  3.87 - 5.11 (MIL/uL)   Hemoglobin 11.9 (*) 12.0 - 15.0 (g/dL)   HCT 30.8 (*) 65.7 - 46.0 (%)   MCV 91.8  78.0 - 100.0 (fL)   MCH 30.7  26.0 - 34.0 (pg)   MCHC 33.4  30.0 - 36.0 (g/dL)   RDW 84.6  96.2 - 95.2 (%)   Platelets 189  150 - 400 (K/uL)  GLUCOSE, CAPILLARY     Status: Normal   Collection Time   10/06/11  5:30 AM      Component Value Range   Glucose-Capillary 93  70 - 99 (mg/dL)  SODIUM, URINE, RANDOM     Status: Normal   Collection Time   10/06/11  5:38 AM      Component Value Range   Sodium, Ur 120    CREATININE, URINE,  RANDOM     Status: Normal   Collection Time   10/06/11  5:38 AM      Component Value Range   Creatinine, Urine 13.01    OSMOLALITY, URINE     Status: Abnormal   Collection Time   10/06/11  5:38 AM      Component Value Range   Osmolality, Ur 301 (*) 390 - 1090 (mOsm/kg)  GLUCOSE, CAPILLARY     Status: Abnormal   Collection Time   10/06/11 11:35 AM      Component Value Range   Glucose-Capillary 111 (*) 70 - 99 (mg/dL)   Comment 1 Notify RN    GLUCOSE, CAPILLARY     Status: Abnormal   Collection Time   10/06/11  4:01 PM      Component  Value Range   Glucose-Capillary 111 (*) 70 - 99 (mg/dL)     Micro Results: No results found for this or any previous visit (from the past 240 hour(s)).  Studies/Results: Dg Chest 2 View  10/04/2011  *RADIOLOGY REPORT*  Clinical Data: Syncope; bradycardia.  CHEST - 2 VIEW  Comparison: Chest radiograph performed 09/06/2011  Findings: The lungs are well-aerated.  Mild bibasilar opacities likely reflect atelectasis.  Mild left midlung scarring is noted. Previously noted vascular congestion has improved.  There is no evidence of pleural effusion or pneumothorax.  The heart is mildly enlarged; the patient is status post median sternotomy, with evidence of prior CABG.  Calcification is noted within the aortic arch.  No acute osseous abnormalities are seen.  IMPRESSION:  1.  Mild bibasilar airspace opacities likely reflect atelectasis; interval improvement in vascular congestion. 2.  Mild cardiomegaly noted.  Original Report Authenticated By: Tonia Ghent, M.D.   Mr Angiogram Head Wo Contrast  09/06/2011  *RADIOLOGY REPORT*  Clinical Data:  Left brain stroke.  Dizziness.  MRI HEAD WITHOUT AND WITH CONTRAST MRA HEAD WITHOUT CONTRAST MRA NECK WITHOUT AND WITH CONTRAST  Technique:  Multiplanar, multiecho pulse sequences of the brain and surrounding structures were obtained without and with intravenous contrast.  Angiographic images of the Circle of Willis were obtained  using MRA technique without intravenous contrast. Angiographic images of the neck were obtained using MRA technique without and with intravenous contrast.  Carotid stenosis measurements (when applicable) are obtained utilizing NASCET criteria, using the distal internal carotid diameter as the denominator.  Contrast: 15mL MULTIHANCE GADOBENATE DIMEGLUMINE 529 MG/ML IV SOLN .  Comparison:  head CT same day.  Multiple previous CT and MRI exams  MRI HEAD  Findings:  Diffusion imaging does not show any acute or subacute infarction.  There are chronic small vessel changes affecting the pons.  There are old small vessel infarctions affecting the cerebellum.  The cerebral hemispheres show chronic small vessel changes affecting the deep and subcortical white matter.  No cortical or large vessel territory infarction.  No mass lesion, hemorrhage, hydrocephalus or extra-axial collection.  No pituitary mass.  No inflammatory sinus disease.  No skull or skull base lesion.  IMPRESSION: No acute or reversible process.  Atrophy and chronic small vessel disease.  MRA HEAD  Findings: Both internal carotid arteries are widely patent into the brain.  The anterior and middle cerebral vessels are patent without proximal stenosis, aneurysm or vascular malformation.  Both vertebral arteries are patent with the left being dominant.  The basilar arteries patent without stenosis.  Posterior circulation branch vessels are patent.  The right PCA takes a fetal origin from the anterior circulation.  IMPRESSION: Negative MR angiography of the large and medium-sized vessels.  No occlusion or correctable proximal stenosis.  MRA NECK  Findings: Branching pattern of brachiocephalic vessels from the arch is normal.  No origin stenoses.  Both common carotid arteries are widely patent to their respective bifurcation.  Both carotid bifurcations show mild atherosclerotic disease.  On the right, the diameter of the proximal internal carotid artery is 4 mm.   Compared to a more distal cervical ICA diameter of 5 mm, this indicates a 20% stenosis.  On the left, the diameters of the proximal internal carotid artery and more distal cervical internal carotid artery of both 5 mm, indicating no stenosis.  Vertebral artery origins are poorly seen because of motion artifact in the chest.  Beyond the origins, both vessels are widely patent through the  cervical region.  IMPRESSION: No significant stenosis at either carotid bifurcation.  Original Report Authenticated By: Thomasenia Sales, M.D.   Mr Angiogram Neck W Wo Contrast  09/06/2011  *RADIOLOGY REPORT*  Clinical Data:  Left brain stroke.  Dizziness.  MRI HEAD WITHOUT AND WITH CONTRAST MRA HEAD WITHOUT CONTRAST MRA NECK WITHOUT AND WITH CONTRAST  Technique:  Multiplanar, multiecho pulse sequences of the brain and surrounding structures were obtained without and with intravenous contrast.  Angiographic images of the Circle of Willis were obtained using MRA technique without intravenous contrast. Angiographic images of the neck were obtained using MRA technique without and with intravenous contrast.  Carotid stenosis measurements (when applicable) are obtained utilizing NASCET criteria, using the distal internal carotid diameter as the denominator.  Contrast: 15mL MULTIHANCE GADOBENATE DIMEGLUMINE 529 MG/ML IV SOLN .  Comparison:  head CT same day.  Multiple previous CT and MRI exams  MRI HEAD  Findings:  Diffusion imaging does not show any acute or subacute infarction.  There are chronic small vessel changes affecting the pons.  There are old small vessel infarctions affecting the cerebellum.  The cerebral hemispheres show chronic small vessel changes affecting the deep and subcortical white matter.  No cortical or large vessel territory infarction.  No mass lesion, hemorrhage, hydrocephalus or extra-axial collection.  No pituitary mass.  No inflammatory sinus disease.  No skull or skull base lesion.  IMPRESSION: No acute or  reversible process.  Atrophy and chronic small vessel disease.  MRA HEAD  Findings: Both internal carotid arteries are widely patent into the brain.  The anterior and middle cerebral vessels are patent without proximal stenosis, aneurysm or vascular malformation.  Both vertebral arteries are patent with the left being dominant.  The basilar arteries patent without stenosis.  Posterior circulation branch vessels are patent.  The right PCA takes a fetal origin from the anterior circulation.  IMPRESSION: Negative MR angiography of the large and medium-sized vessels.  No occlusion or correctable proximal stenosis.  MRA NECK  Findings: Branching pattern of brachiocephalic vessels from the arch is normal.  No origin stenoses.  Both common carotid arteries are widely patent to their respective bifurcation.  Both carotid bifurcations show mild atherosclerotic disease.  On the right, the diameter of the proximal internal carotid artery is 4 mm.  Compared to a more distal cervical ICA diameter of 5 mm, this indicates a 20% stenosis.  On the left, the diameters of the proximal internal carotid artery and more distal cervical internal carotid artery of both 5 mm, indicating no stenosis.  Vertebral artery origins are poorly seen because of motion artifact in the chest.  Beyond the origins, both vessels are widely patent through the cervical region.  IMPRESSION: No significant stenosis at either carotid bifurcation.  Original Report Authenticated By: Thomasenia Sales, M.D.   Mr Laqueta Jean Wo Contrast  09/06/2011  *RADIOLOGY REPORT*  Clinical Data:  Left brain stroke.  Dizziness.  MRI HEAD WITHOUT AND WITH CONTRAST MRA HEAD WITHOUT CONTRAST MRA NECK WITHOUT AND WITH CONTRAST  Technique:  Multiplanar, multiecho pulse sequences of the brain and surrounding structures were obtained without and with intravenous contrast.  Angiographic images of the Circle of Willis were obtained using MRA technique without intravenous contrast.  Angiographic images of the neck were obtained using MRA technique without and with intravenous contrast.  Carotid stenosis measurements (when applicable) are obtained utilizing NASCET criteria, using the distal internal carotid diameter as the denominator.  Contrast: 15mL MULTIHANCE GADOBENATE DIMEGLUMINE 529  MG/ML IV SOLN .  Comparison:  head CT same day.  Multiple previous CT and MRI exams  MRI HEAD  Findings:  Diffusion imaging does not show any acute or subacute infarction.  There are chronic small vessel changes affecting the pons.  There are old small vessel infarctions affecting the cerebellum.  The cerebral hemispheres show chronic small vessel changes affecting the deep and subcortical white matter.  No cortical or large vessel territory infarction.  No mass lesion, hemorrhage, hydrocephalus or extra-axial collection.  No pituitary mass.  No inflammatory sinus disease.  No skull or skull base lesion.  IMPRESSION: No acute or reversible process.  Atrophy and chronic small vessel disease.  MRA HEAD  Findings: Both internal carotid arteries are widely patent into the brain.  The anterior and middle cerebral vessels are patent without proximal stenosis, aneurysm or vascular malformation.  Both vertebral arteries are patent with the left being dominant.  The basilar arteries patent without stenosis.  Posterior circulation branch vessels are patent.  The right PCA takes a fetal origin from the anterior circulation.  IMPRESSION: Negative MR angiography of the large and medium-sized vessels.  No occlusion or correctable proximal stenosis.  MRA NECK  Findings: Branching pattern of brachiocephalic vessels from the arch is normal.  No origin stenoses.  Both common carotid arteries are widely patent to their respective bifurcation.  Both carotid bifurcations show mild atherosclerotic disease.  On the right, the diameter of the proximal internal carotid artery is 4 mm.  Compared to a more distal cervical ICA diameter of  5 mm, this indicates a 20% stenosis.  On the left, the diameters of the proximal internal carotid artery and more distal cervical internal carotid artery of both 5 mm, indicating no stenosis.  Vertebral artery origins are poorly seen because of motion artifact in the chest.  Beyond the origins, both vessels are widely patent through the cervical region.  IMPRESSION: No significant stenosis at either carotid bifurcation.  Original Report Authenticated By: Thomasenia Sales, M.D.   Medications: Scheduled Meds:   . clopidogrel  75 mg Oral Q breakfast  . docusate sodium  100 mg Oral BID  . felodipine  5 mg Oral Daily  . heparin  5,000 Units Subcutaneous Q8H  . potassium chloride  40 mEq Oral BID  . rosuvastatin  20 mg Oral q1800  . senna  1 tablet Oral BID   Continuous Infusions:   . sodium chloride 100 mL/hr at 10/06/11 0204   PRN Meds:.acetaminophen, acetaminophen, ondansetron (ZOFRAN) IV, ondansetron  Assessment/Plan: Patient Active Hospital Problem List: Syncope and collapse: Most likely secondary to orthostatic hypotension, adequately hydrated.  Hypotension (10/05/2011)  RESOLVED CAD (coronary artery disease) () Cardiac enzymes negative. ekg NSR. Hypertension () Better controlled  Possible d/c in am.    LOS: 2 days   Cristy Colmenares 10/06/2011, 5:22 PM

## 2011-10-07 ENCOUNTER — Inpatient Hospital Stay (HOSPITAL_COMMUNITY): Payer: Medicare Other

## 2011-10-07 LAB — BASIC METABOLIC PANEL
Calcium: 8.4 mg/dL (ref 8.4–10.5)
GFR calc Af Amer: 56 mL/min — ABNORMAL LOW (ref >90)
GFR calc non Af Amer: 49 mL/min — ABNORMAL LOW (ref >90)
Glucose, Bld: 140 mg/dL — ABNORMAL HIGH (ref 70–99)
Potassium: 3.4 mEq/L — ABNORMAL LOW (ref 3.5–5.1)
Sodium: 138 mEq/L (ref 135–145)

## 2011-10-07 LAB — GLUCOSE, CAPILLARY: Glucose-Capillary: 130 mg/dL — ABNORMAL HIGH (ref 70–99)

## 2011-10-07 MED ORDER — HYDRALAZINE HCL 20 MG/ML IJ SOLN
5.0000 mg | Freq: Three times a day (TID) | INTRAMUSCULAR | Status: DC | PRN
  Administered 2011-10-07: 5 mg via INTRAVENOUS
  Filled 2011-10-07 (×4): qty 0.25

## 2011-10-07 MED ORDER — POTASSIUM CHLORIDE CRYS ER 20 MEQ PO TBCR
40.0000 meq | EXTENDED_RELEASE_TABLET | Freq: Two times a day (BID) | ORAL | Status: AC
  Administered 2011-10-08 (×2): 40 meq via ORAL
  Filled 2011-10-07 (×2): qty 2

## 2011-10-07 MED ORDER — XENON XE 133 GAS
10.0000 | GAS_FOR_INHALATION | Freq: Once | RESPIRATORY_TRACT | Status: AC | PRN
  Administered 2011-10-07: 10 via RESPIRATORY_TRACT

## 2011-10-07 MED ORDER — TECHNETIUM TO 99M ALBUMIN AGGREGATED
6.0000 | Freq: Once | INTRAVENOUS | Status: AC | PRN
  Administered 2011-10-07: 6 via INTRAVENOUS

## 2011-10-07 MED ORDER — HYDRALAZINE HCL 20 MG/ML IJ SOLN
5.0000 mg | Freq: Once | INTRAMUSCULAR | Status: AC
  Administered 2011-10-07: 5 mg via INTRAVENOUS
  Filled 2011-10-07 (×2): qty 0.25

## 2011-10-07 MED ORDER — HYDRALAZINE HCL 25 MG PO TABS
25.0000 mg | ORAL_TABLET | Freq: Three times a day (TID) | ORAL | Status: DC
  Administered 2011-10-07 – 2011-10-08 (×4): 25 mg via ORAL
  Filled 2011-10-07 (×6): qty 1

## 2011-10-07 NOTE — Progress Notes (Signed)
Utilization Review Completed.Kayla Ramos T12/01/2011   

## 2011-10-07 NOTE — Progress Notes (Signed)
Subjective: i dont feel good", 1 episode of vomiting this am. Feel warm.  No chest pain or sob.   Objective: Weight change: -1.553 kg (-3 lb 6.8 oz)  Intake/Output Summary (Last 24 hours) at 10/07/11 1121 Last data filed at 10/07/11 0900  Gross per 24 hour  Intake    240 ml  Output    702 ml  Net   -462 ml  Exam  General: alert afebrile, comfortable, but restless CVS: s1 and s2 heard, LUNGS: clear to auscultation bilateral. Abdomen: soft non tender, bowel sounds heard Extremities: no edema    Lab Results: Results for orders placed during the hospital encounter of 10/04/11 (from the past 24 hour(s))  GLUCOSE, CAPILLARY     Status: Abnormal   Collection Time   10/06/11 11:35 AM      Component Value Range   Glucose-Capillary 111 (*) 70 - 99 (mg/dL)   Comment 1 Notify RN    GLUCOSE, CAPILLARY     Status: Abnormal   Collection Time   10/06/11  4:01 PM      Component Value Range   Glucose-Capillary 111 (*) 70 - 99 (mg/dL)  GLUCOSE, CAPILLARY     Status: Abnormal   Collection Time   10/07/11  6:13 AM      Component Value Range   Glucose-Capillary 130 (*) 70 - 99 (mg/dL)     Micro Results: No results found for this or any previous visit (from the past 240 hour(s)).  Studies/Results: Dg Chest 2 View  10/04/2011  *RADIOLOGY REPORT*  Clinical Data: Syncope; bradycardia.  CHEST - 2 VIEW  Comparison: Chest radiograph performed 09/06/2011  Findings: The lungs are well-aerated.  Mild bibasilar opacities likely reflect atelectasis.  Mild left midlung scarring is noted. Previously noted vascular congestion has improved.  There is no evidence of pleural effusion or pneumothorax.  The heart is mildly enlarged; the patient is status post median sternotomy, with evidence of prior CABG.  Calcification is noted within the aortic arch.  No acute osseous abnormalities are seen.  IMPRESSION:  1.  Mild bibasilar airspace opacities likely reflect atelectasis; interval improvement in vascular  congestion. 2.  Mild cardiomegaly noted.  Original Report Authenticated By: Tonia Ghent, M.D.   Medications: Scheduled Meds:   . clopidogrel  75 mg Oral Q breakfast  . docusate sodium  100 mg Oral BID  . felodipine  5 mg Oral Daily  . heparin  5,000 Units Subcutaneous Q8H  . hydrALAZINE  5 mg Intravenous Once  . hydrALAZINE  25 mg Oral Q8H  . rosuvastatin  20 mg Oral q1800  . senna  1 tablet Oral BID   Continuous Infusions:   . sodium chloride 100 mL/hr at 10/06/11 2323   PRN Meds:.acetaminophen, acetaminophen, hydrALAZINE, ondansetron (ZOFRAN) IV, ondansetron  Assessment/Plan: Patient Active Hospital Problem List:   Syncope and collapse (10/05/2011)   Assessment& Plan:from  orthostatic hypotension. Pt was recently admitted for TIA in the first week of November and was started on plavix, she had an echocardiogram showing good left ventricular systolic function, but with elevated right ventricular pressures. This is her second admission in November,. Even though she had positive orthostatics on admission, will get a V/Q scan to rule out chronic PE's.    Continue with plavix. Her cardiac enzymes were negative. Hyperlipidemia    On crestor    S/P aortic valve repair   no stenosis on recent echo.  CAD S/P CABG x 3    Cardiac enzymes negative and stable  Hypertension   uncontrolled. She is on felodipine and lisinopril at SNF. We are holding the lisinopril for ARF. Start the pt on hydralazine.  Anemia of chronic Disease: stable.   Acute Renal Failure:  Improving, could be pre renal, on iv fluids.  Recheck labs today.   LOS: 3 days   Kayla Ramos 10/07/2011, 11:21 AM

## 2011-10-07 NOTE — Progress Notes (Signed)
CSW completed assessment and placed in shadow chart. CSW sent prior approval information to [facility] and spoke with Carollee Herter. CSW will continue to follow. [place] [phone]

## 2011-10-08 LAB — GLUCOSE, CAPILLARY: Glucose-Capillary: 132 mg/dL — ABNORMAL HIGH (ref 70–99)

## 2011-10-08 MED ORDER — HYDRALAZINE HCL 25 MG PO TABS: 25.0000 mg | ORAL_TABLET | Freq: Three times a day (TID) | ORAL | Status: DC

## 2011-10-08 MED ORDER — FELODIPINE ER 10 MG PO TB24
10.0000 mg | ORAL_TABLET | Freq: Every day | ORAL | Status: DC
  Administered 2011-10-08: 10 mg via ORAL
  Filled 2011-10-08: qty 1

## 2011-10-08 MED ORDER — ONDANSETRON HCL 4 MG PO TABS: 4.0000 mg | ORAL_TABLET | Freq: Three times a day (TID) | ORAL | Status: AC | PRN

## 2011-10-08 MED ORDER — PANTOPRAZOLE SODIUM 40 MG PO TBEC
40.0000 mg | DELAYED_RELEASE_TABLET | Freq: Once | ORAL | Status: AC
  Administered 2011-10-08: 40 mg via ORAL
  Filled 2011-10-08: qty 1

## 2011-10-08 MED ORDER — HYDROCODONE-ACETAMINOPHEN 7.5-325 MG PO TABS: 1.0000 | ORAL_TABLET | Freq: Three times a day (TID) | ORAL | Status: DC | PRN

## 2011-10-08 MED ORDER — MECLIZINE HCL 25 MG PO TABS
25.0000 mg | ORAL_TABLET | Freq: Once | ORAL | Status: AC
  Administered 2011-10-08: 25 mg via ORAL
  Filled 2011-10-08 (×2): qty 1

## 2011-10-08 NOTE — Discharge Summary (Signed)
Physician Discharge Summary  Patient ID: Kayla Ramos MRN: [id number] DOB/AGE: 75-Jun-1926 75 y.o.  Admit date: 10/04/2011 Discharge date: 10/08/2011  Primary Care Physician:  No primary provider on file.   Discharge Diagnoses:    Principal Problem:  Syncope and collapse probably Orthostatic hypotension Active Problems:  S/P CABG x 3  Hyperlipidemia  Hyperglycemia  Bradycardia  Hypotension  S/P aortic valve repair  CAD (coronary artery disease)  Hypertension    Current Discharge Medication List    START taking these medications   Details  hydrALAZINE (APRESOLINE) 25 MG tablet Take 1 tablet (25 mg total) by mouth every 8 (eight) hours.      CONTINUE these medications which have NOT CHANGED   Details  acetaminophen (TYLENOL) 500 MG tablet Take 1,000 mg by mouth daily as needed. For headache     atorvastatin (LIPITOR) 40 MG tablet Take 40 mg by mouth daily.      bisacodyl (DULCOLAX) 10 MG suppository Place 10 mg rectally as needed. For constipation. If nit relieved by Milk of Mag, give Bisacodyl x1 dose.     clopidogrel (PLAVIX) 75 MG tablet Take 75 mg by mouth daily with breakfast.      diazepam (VALIUM) 5 MG tablet Take 5 mg by mouth 2 (two) times daily as needed. For anxiety    felodipine (PLENDIL) 5 MG 24 hr tablet Take 5 mg by mouth daily.      gabapentin (NEURONTIN) 300 MG capsule Take 300 mg by mouth 3 (three) times daily.     HYDROcodone-acetaminophen (NORCO) 7.5-325 MG per tablet Take 1 tablet by mouth every 8 (eight) hours as needed. For pain     lisinopril (PRINIVIL,ZESTRIL) 20 MG tablet Take 30 mg by mouth daily.      Magnesium Hydroxide (MILK OF MAGNESIA PO) Take 30 mLs by mouth daily as needed. For constipation. If no BM in 3 days give Milk of Mag x1 dose.     meclizine (ANTIVERT) 25 MG tablet Take 25 mg by mouth 3 (three) times daily as needed. For dizziness     rosuvastatin (CRESTOR) 20 MG tablet Take 20 mg by mouth daily at 6 PM.        STOP  taking these medications     Sodium Phosphates (RA SALINE ENEMA RE)          Disposition and Follow-up:  Discharge to SNF. Follow up with PCP/MD at the facility as needed  Consults:    None.    Significant Diagnostic Studies:  Dg Chest 2 View  10/04/2011  *RADIOLOGY REPORT*  Clinical Data: Syncope; bradycardia.  CHEST - 2 VIEW  Comparison: Chest radiograph performed 09/06/2011  Findings: The lungs are well-aerated.  Mild bibasilar opacities likely reflect atelectasis.  Mild left midlung scarring is noted. Previously noted vascular congestion has improved.  There is no evidence of pleural effusion or pneumothorax.  The heart is mildly enlarged; the patient is status post median sternotomy, with evidence of prior CABG.  Calcification is noted within the aortic arch.  No acute osseous abnormalities are seen.  IMPRESSION:  1.  Mild bibasilar airspace opacities likely reflect atelectasis; interval improvement in vascular congestion. 2.  Mild cardiomegaly noted.  Original Report Authenticated By: Tonia Ghent, M.D.    Brief H and P: For complete details please refer to admission H and P, but in brief admitted for syncope, was found to be hypotensive,and in acute renal failure.     Hospital Course:  Principal Problem:  Syncope and collapse:  most likely secondary to orthostatic hypotension. She had a recent echocardiogram which showed good systolic function of the left ventri cule, with elevated right ventricular pressures. A V/Q scan was obtained, showed low probability for pulmonary edema.    S/P CABG x 3: Plavix   Hyperlipidemia: Crestor   Hyperglycemia   Hypotension: Resolved   S/P aortic valve repair  CAD (coronary artery disease): stable.   Hypertension : slightly high. But would not aggressively treat her HTN,.  Hypokalemia repleted    Tim hypokalemiae spent on Discharge: 45  Minutes.   SignedKathlen Mody 10/08/2011, 1:48 PM

## 2011-10-08 NOTE — Progress Notes (Signed)
CSW received insurance authorization from [facility]. CSW faxed dc summary and medications to SNF. SNF agreeable to patient admitting. CSW informed patient, patient's granddtr and RN of dc and all were agreeable. CSW prepared dc packet. CSW coordinated transportation via [place]. CSW is signing off. [place] [place] [phone]

## 2011-10-08 NOTE — Progress Notes (Signed)
CSW called [facility] and left a message with Clydie Braun regarding getting authorization for patient to return to SNF. CSW will continue to follow. Unk [place] [phone]

## 2011-10-08 NOTE — Plan of Care (Signed)
Problem: Discharge Progression Outcomes Goal: Discharge plan in place and appropriate Outcome: Completed/Met Date Met:  10/08/11  Pt  D/c  2035

## 2011-10-08 NOTE — Progress Notes (Signed)
PT Cancellation Note  Treatment cancelled today due to patient's refusal to participate.  [facility] [phone] (pager)

## 2011-10-08 NOTE — Progress Notes (Signed)
CSW called [facility] and left another message regarding authorization. Patient cannot dc to SNF until auth is received. CSW will continue to follow. [place]k [place] [phone]

## 2013-03-02 ENCOUNTER — Emergency Department (HOSPITAL_COMMUNITY): Payer: Medicare Other

## 2013-03-02 ENCOUNTER — Encounter (HOSPITAL_COMMUNITY): Payer: Self-pay | Admitting: Emergency Medicine

## 2013-03-02 ENCOUNTER — Inpatient Hospital Stay (HOSPITAL_COMMUNITY)
Admission: EM | Admit: 2013-03-02 | Discharge: 2013-03-05 | DRG: 176 | Disposition: A | Discharge status: Home Health | Payer: Medicare Other | Attending: Family Medicine | Admitting: Family Medicine

## 2013-03-02 DIAGNOSIS — M7989 Other specified soft tissue disorders: Secondary | ICD-10-CM

## 2013-03-02 DIAGNOSIS — M549 Dorsalgia, unspecified: Secondary | ICD-10-CM | POA: Diagnosis present

## 2013-03-02 DIAGNOSIS — Z8673 Personal history of transient ischemic attack (TIA), and cerebral infarction without residual deficits: Secondary | ICD-10-CM

## 2013-03-02 DIAGNOSIS — Z951 Presence of aortocoronary bypass graft: Secondary | ICD-10-CM

## 2013-03-02 DIAGNOSIS — R55 Syncope and collapse: Secondary | ICD-10-CM

## 2013-03-02 DIAGNOSIS — E785 Hyperlipidemia, unspecified: Secondary | ICD-10-CM | POA: Diagnosis present

## 2013-03-02 DIAGNOSIS — I872 Venous insufficiency (chronic) (peripheral): Secondary | ICD-10-CM | POA: Diagnosis present

## 2013-03-02 DIAGNOSIS — I5032 Chronic diastolic (congestive) heart failure: Secondary | ICD-10-CM | POA: Diagnosis present

## 2013-03-02 DIAGNOSIS — G8929 Other chronic pain: Secondary | ICD-10-CM | POA: Diagnosis present

## 2013-03-02 DIAGNOSIS — R531 Weakness: Secondary | ICD-10-CM

## 2013-03-02 DIAGNOSIS — R739 Hyperglycemia, unspecified: Secondary | ICD-10-CM

## 2013-03-02 DIAGNOSIS — M419 Scoliosis, unspecified: Secondary | ICD-10-CM

## 2013-03-02 DIAGNOSIS — I6501 Occlusion and stenosis of right vertebral artery: Secondary | ICD-10-CM

## 2013-03-02 DIAGNOSIS — Z9889 Other specified postprocedural states: Secondary | ICD-10-CM

## 2013-03-02 DIAGNOSIS — R5381 Other malaise: Secondary | ICD-10-CM | POA: Diagnosis present

## 2013-03-02 DIAGNOSIS — R42 Dizziness and giddiness: Secondary | ICD-10-CM | POA: Diagnosis present

## 2013-03-02 DIAGNOSIS — G459 Transient cerebral ischemic attack, unspecified: Secondary | ICD-10-CM | POA: Diagnosis present

## 2013-03-02 DIAGNOSIS — I10 Essential (primary) hypertension: Secondary | ICD-10-CM | POA: Diagnosis present

## 2013-03-02 DIAGNOSIS — Z79899 Other long term (current) drug therapy: Secondary | ICD-10-CM

## 2013-03-02 DIAGNOSIS — R001 Bradycardia, unspecified: Secondary | ICD-10-CM

## 2013-03-02 DIAGNOSIS — I2699 Other pulmonary embolism without acute cor pulmonale: Secondary | ICD-10-CM

## 2013-03-02 DIAGNOSIS — I509 Heart failure, unspecified: Secondary | ICD-10-CM | POA: Diagnosis present

## 2013-03-02 DIAGNOSIS — I2510 Atherosclerotic heart disease of native coronary artery without angina pectoris: Secondary | ICD-10-CM | POA: Diagnosis present

## 2013-03-02 DIAGNOSIS — Z954 Presence of other heart-valve replacement: Secondary | ICD-10-CM

## 2013-03-02 DIAGNOSIS — M4120 Other idiopathic scoliosis, site unspecified: Secondary | ICD-10-CM | POA: Diagnosis present

## 2013-03-02 HISTORY — DX: Low back pain: M54.5

## 2013-03-02 HISTORY — DX: Transient cerebral ischemic attack, unspecified: G45.9

## 2013-03-02 HISTORY — DX: Headache, unspecified: R51.9

## 2013-03-02 HISTORY — DX: Other chronic pain: G89.29

## 2013-03-02 HISTORY — DX: Headache: R51

## 2013-03-02 HISTORY — DX: Anxiety disorder, unspecified: F41.9

## 2013-03-02 HISTORY — DX: Other pulmonary embolism without acute cor pulmonale: I26.99

## 2013-03-02 HISTORY — DX: Unspecified osteoarthritis, unspecified site: M19.90

## 2013-03-02 HISTORY — DX: Shortness of breath: R06.02

## 2013-03-02 HISTORY — DX: Gastro-esophageal reflux disease without esophagitis: K21.9

## 2013-03-02 LAB — COMPREHENSIVE METABOLIC PANEL
ALT: 13 U/L (ref 0–35)
AST: 22 U/L (ref 0–37)
Alkaline Phosphatase: 78 U/L (ref 39–117)
CO2: 25 mEq/L (ref 19–32)
Calcium: 9 mg/dL (ref 8.4–10.5)
GFR calc Af Amer: 55 mL/min — ABNORMAL LOW (ref >90)
GFR calc non Af Amer: 48 mL/min — ABNORMAL LOW (ref >90)
Glucose, Bld: 126 mg/dL — ABNORMAL HIGH (ref 70–99)
Potassium: 4.3 mEq/L (ref 3.5–5.1)
Sodium: 137 mEq/L (ref 135–145)

## 2013-03-02 LAB — CBC WITH DIFFERENTIAL/PLATELET
Basophils Absolute: 0 10*3/uL (ref 0.0–0.1)
Lymphocytes Relative: 8 % — ABNORMAL LOW (ref 12–46)
Lymphs Abs: 0.6 10*3/uL — ABNORMAL LOW (ref 0.7–4.0)
Neutro Abs: 6.7 10*3/uL (ref 1.7–7.7)
Neutrophils Relative %: 85 % — ABNORMAL HIGH (ref 43–77)
Platelets: 213 10*3/uL (ref 150–400)
RBC: 4.4 MIL/uL (ref 3.87–5.11)
RDW: 13.8 % (ref 11.5–15.5)
WBC: 8 10*3/uL (ref 4.0–10.5)

## 2013-03-02 LAB — URINALYSIS, ROUTINE W REFLEX MICROSCOPIC
Bilirubin Urine: NEGATIVE
Nitrite: NEGATIVE
Urobilinogen, UA: 1 mg/dL (ref 0.0–1.0)
pH: 6 (ref 5.0–8.0)

## 2013-03-02 LAB — HEPATIC FUNCTION PANEL
AST: 23 U/L (ref 0–37)
Albumin: 3.8 g/dL (ref 3.5–5.2)
Bilirubin, Direct: 0.2 mg/dL (ref 0.0–0.3)

## 2013-03-02 LAB — MAGNESIUM: Magnesium: 2.1 mg/dL (ref 1.5–2.5)

## 2013-03-02 LAB — PROTIME-INR: Prothrombin Time: 13.2 seconds (ref 11.6–15.2)

## 2013-03-02 MED ORDER — ACETAMINOPHEN 650 MG RE SUPP: 650.0000 mg | Freq: Four times a day (QID) | RECTAL | Status: DC | PRN

## 2013-03-02 MED ORDER — HEPARIN BOLUS VIA INFUSION: 3000.0000 [IU] | Freq: Once | INTRAVENOUS | Status: DC

## 2013-03-02 MED ORDER — SODIUM CHLORIDE 0.9 % IJ SOLN
3.0000 mL | Freq: Two times a day (BID) | INTRAMUSCULAR | Status: DC
  Administered 2013-03-02 – 2013-03-05 (×6): 3 mL via INTRAVENOUS

## 2013-03-02 MED ORDER — COUMADIN BOOK
Freq: Once | Status: AC
  Administered 2013-03-02: 20:00:00
  Filled 2013-03-02: qty 1

## 2013-03-02 MED ORDER — GABAPENTIN 300 MG PO CAPS
600.0000 mg | ORAL_CAPSULE | Freq: Every day | ORAL | Status: DC
  Filled 2013-03-02: qty 2

## 2013-03-02 MED ORDER — ONDANSETRON HCL 4 MG/2ML IJ SOLN: 4.0000 mg | Freq: Three times a day (TID) | INTRAMUSCULAR | Status: DC | PRN

## 2013-03-02 MED ORDER — IOHEXOL 350 MG/ML SOLN
100.0000 mL | Freq: Once | INTRAVENOUS | Status: AC | PRN
  Administered 2013-03-02: 100 mL via INTRAVENOUS

## 2013-03-02 MED ORDER — WARFARIN SODIUM 3 MG PO TABS
3.0000 mg | ORAL_TABLET | Freq: Once | ORAL | Status: AC
  Administered 2013-03-02: 3 mg via ORAL
  Filled 2013-03-02: qty 1

## 2013-03-02 MED ORDER — SODIUM CHLORIDE 0.9 % IV SOLN: 250.0000 mL | INTRAVENOUS | Status: DC | PRN

## 2013-03-02 MED ORDER — LISINOPRIL 20 MG PO TABS
20.0000 mg | ORAL_TABLET | Freq: Every day | ORAL | Status: DC
  Administered 2013-03-03: 20 mg via ORAL
  Filled 2013-03-02: qty 1

## 2013-03-02 MED ORDER — FUROSEMIDE 10 MG/ML IJ SOLN
40.0000 mg | Freq: Once | INTRAMUSCULAR | Status: AC
  Administered 2013-03-02: 40 mg via INTRAVENOUS
  Filled 2013-03-02: qty 4

## 2013-03-02 MED ORDER — WARFARIN VIDEO
Freq: Once | Status: AC
  Administered 2013-03-02: 20:00:00

## 2013-03-02 MED ORDER — FELODIPINE ER 5 MG PO TB24
5.0000 mg | ORAL_TABLET | Freq: Every day | ORAL | Status: DC
  Administered 2013-03-03 – 2013-03-05 (×3): 5 mg via ORAL
  Filled 2013-03-02 (×3): qty 1

## 2013-03-02 MED ORDER — GABAPENTIN 300 MG PO CAPS
600.0000 mg | ORAL_CAPSULE | Freq: Every day | ORAL | Status: DC
  Administered 2013-03-03 – 2013-03-05 (×3): 600 mg via ORAL
  Filled 2013-03-02 (×3): qty 2

## 2013-03-02 MED ORDER — ACETAMINOPHEN 325 MG PO TABS: 650.0000 mg | ORAL_TABLET | Freq: Four times a day (QID) | ORAL | Status: DC | PRN

## 2013-03-02 MED ORDER — SODIUM CHLORIDE 0.9 % IJ SOLN: 3.0000 mL | INTRAMUSCULAR | Status: DC | PRN

## 2013-03-02 MED ORDER — ATORVASTATIN CALCIUM 40 MG PO TABS
40.0000 mg | ORAL_TABLET | Freq: Every day | ORAL | Status: DC
  Administered 2013-03-02 – 2013-03-05 (×4): 40 mg via ORAL
  Filled 2013-03-02 (×4): qty 1

## 2013-03-02 MED ORDER — PANTOPRAZOLE SODIUM 40 MG PO TBEC
40.0000 mg | DELAYED_RELEASE_TABLET | Freq: Every day | ORAL | Status: DC
  Administered 2013-03-03 – 2013-03-05 (×3): 40 mg via ORAL
  Filled 2013-03-02 (×2): qty 1

## 2013-03-02 MED ORDER — ONDANSETRON HCL 4 MG PO TABS: 4.0000 mg | ORAL_TABLET | Freq: Four times a day (QID) | ORAL | Status: DC | PRN

## 2013-03-02 MED ORDER — HEPARIN (PORCINE) IN NACL 100-0.45 UNIT/ML-% IJ SOLN
900.0000 [IU]/h | INTRAMUSCULAR | Status: DC
  Filled 2013-03-02: qty 250

## 2013-03-02 MED ORDER — WARFARIN - PHARMACIST DOSING INPATIENT: Freq: Every day | Status: DC

## 2013-03-02 MED ORDER — DIAZEPAM 5 MG PO TABS: 2.5000 mg | ORAL_TABLET | Freq: Two times a day (BID) | ORAL | Status: DC | PRN

## 2013-03-02 MED ORDER — ONDANSETRON HCL 4 MG/2ML IJ SOLN: 4.0000 mg | Freq: Four times a day (QID) | INTRAMUSCULAR | Status: DC | PRN

## 2013-03-02 MED ORDER — ENOXAPARIN SODIUM 80 MG/0.8ML ~~LOC~~ SOLN
65.0000 mg | Freq: Two times a day (BID) | SUBCUTANEOUS | Status: DC
  Administered 2013-03-02 – 2013-03-05 (×7): 65 mg via SUBCUTANEOUS
  Filled 2013-03-02 (×8): qty 0.8

## 2013-03-02 MED ORDER — HYDROCODONE-ACETAMINOPHEN 7.5-325 MG PO TABS
1.0000 | ORAL_TABLET | Freq: Three times a day (TID) | ORAL | Status: DC | PRN
  Administered 2013-03-02 – 2013-03-03 (×3): 1 via ORAL
  Filled 2013-03-02 (×3): qty 1

## 2013-03-02 MED ORDER — GABAPENTIN 300 MG PO CAPS
300.0000 mg | ORAL_CAPSULE | Freq: Every day | ORAL | Status: DC
  Administered 2013-03-02 – 2013-03-04 (×3): 300 mg via ORAL
  Filled 2013-03-02 (×4): qty 1

## 2013-03-02 NOTE — ED Provider Notes (Signed)
History     CSN: [id number]  Arrival date & time 03/02/13  1125   First MD Initiated Contact with Patient 03/02/13 1145      Chief Complaint  Patient presents with  . Leg Swelling    (Consider location/radiation/quality/duration/timing/severity/associated sxs/prior treatment) HPI Comments: Patient complains of increasing bilateral lower extremity edema for the past week. She has a history of CAD status post CABG aortic valve replacement. Reports no history of CHF. He denies any shortness of breath or chest pain. Is not take any diuretics. His difficulty walking due to the pain is on her legs as well she denies any weakness, numbness or tingling. No fevers, vomiting. Her right leg is hurting a bit more than her left. She has difficulty getting up the stairs because of the pain. She denies any falls.  The history is provided by the patient and a relative.    Past Medical History  Diagnosis Date  . Hypertension   . Vertigo   . CAD (coronary artery disease) 2000    s/p CABG   . Hyperlipidemia   . Chronic back pain   . Scoliosis   . Vertebral artery stenosis 2006    75-90% per MR/MRA in 2006   . S/P aortic valve repair 2000    Past Surgical History  Procedure Laterality Date  . Coronary artery bypass graft    . Cholecystectomy    . Valve replacement      No family history on file.  History  Substance Use Topics  . Smoking status: Never Smoker   . Smokeless tobacco: Not on file  . Alcohol Use: No    OB History   Grav Para Term Preterm Abortions TAB SAB Ect Mult Living                  Review of Systems  Constitutional: Negative for fever, activity change and appetite change.  HENT: Negative for congestion and rhinorrhea.   Eyes: Negative for visual disturbance.  Respiratory: Negative for shortness of breath.   Cardiovascular: Positive for leg swelling. Negative for chest pain.  Gastrointestinal: Negative for nausea, vomiting and abdominal pain.  Genitourinary:  Negative for dysuria, hematuria, vaginal bleeding and vaginal discharge.  Musculoskeletal: Negative for back pain.  Skin: Negative for rash.  Neurological: Negative for dizziness, tremors and weakness.  A complete 10 system review of systems was obtained and all systems are negative except as noted in the HPI and PMH.    Allergies  Review of patient's allergies indicates no known allergies.  Home Medications   Current Outpatient Rx  Name  Route  Sig  Dispense  Refill  . atorvastatin (LIPITOR) 40 MG tablet   Oral   Take 40 mg by mouth daily.           . clopidogrel (PLAVIX) 75 MG tablet   Oral   Take 75 mg by mouth daily.         . diazepam (VALIUM) 5 MG tablet   Oral   Take 5 mg by mouth 2 (two) times daily as needed. For anxiety         . felodipine (PLENDIL) 5 MG 24 hr tablet   Oral   Take 5 mg by mouth daily.           Marland Kitchen gabapentin (NEURONTIN) 300 MG capsule   Oral   Take 300-600 mg by mouth 2 (two) times daily. Take 2 capsules in the morning and 1 capsule at night         .  HYDROcodone-acetaminophen (NORCO) 7.5-325 MG per tablet   Oral   Take 1 tablet by mouth every 8 (eight) hours as needed. For pain   30 tablet   0   . lisinopril (PRINIVIL,ZESTRIL) 20 MG tablet   Oral   Take 20 mg by mouth daily.          . meclizine (ANTIVERT) 25 MG tablet   Oral   Take 25 mg by mouth 3 (three) times daily. For dizziness         . meloxicam (MOBIC) 7.5 MG tablet   Oral   Take 7.5 mg by mouth daily.           BP 137/55  Pulse 66  Temp(Src) 97.6 F (36.4 C) (Oral)  Resp 15  SpO2 97%  Physical Exam  Constitutional: She is oriented to person, place, and time. She appears well-developed and well-nourished. No distress.  HENT:  Head: Normocephalic and atraumatic.  Mouth/Throat: Oropharynx is clear and moist. No oropharyngeal exudate.  Eyes: Conjunctivae are normal. Pupils are equal, round, and reactive to light.  Neck: Normal range of motion.   Cardiovascular: Normal rate, regular rhythm and normal heart sounds.   No murmur heard. Pulmonary/Chest: Effort normal and breath sounds normal. No respiratory distress.  Abdominal: Soft. There is no tenderness. There is no rebound and no guarding.  Musculoskeletal: Normal range of motion. She exhibits edema.  Symmetric pitting edema to mid thigh bilaterally. +2 DP and PT pulses. Ankle flexion and extension intact.  Neurological: She is alert and oriented to person, place, and time. No cranial nerve deficit. She exhibits normal muscle tone. Coordination normal.  Skin: Skin is warm.    ED Course  Procedures (including critical care time)  Labs Reviewed  CBC WITH DIFFERENTIAL - Abnormal; Notable for the following:    Neutrophils Relative 85 (*)    Lymphocytes Relative 8 (*)    Lymphs Abs 0.6 (*)    All other components within normal limits  COMPREHENSIVE METABOLIC PANEL - Abnormal; Notable for the following:    Glucose, Bld 126 (*)    GFR calc non Af Amer 48 (*)    GFR calc Af Amer 55 (*)    All other components within normal limits  PRO B NATRIURETIC PEPTIDE - Abnormal; Notable for the following:    Pro B Natriuretic peptide (BNP) 588.1 (*)    All other components within normal limits  D-DIMER, QUANTITATIVE - Abnormal; Notable for the following:    D-Dimer, Quant 1.29 (*)    All other components within normal limits  TROPONIN I  URINALYSIS, ROUTINE W REFLEX MICROSCOPIC  PROTIME-INR   Dg Chest 2 View  03/02/2013  *RADIOLOGY REPORT*  Clinical Data: Bilateral leg swelling, confusion  CHEST - 2 VIEW  Comparison: 10/07/2011  Findings: Cardiomegaly again noted.  Status post median sternotomy. Atherosclerotic calcifications of thoracic aorta.  Hiatal hernia. Stable elevation of the right hemidiaphragm.  No acute infiltrate or pulmonary edema.  Osteopenia and degenerative changes thoracic spine.  IMPRESSION:  Status post median sternotomy.  Atherosclerotic calcifications of thoracic  aorta.  Hiatal hernia.  Stable elevation of the right hemidiaphragm.  No acute infiltrate or pulmonary edema.  Osteopenia and degenerative changes thoracic spine.   Original Report Authenticated By: Natasha Mead, M.D.      No diagnosis found.    MDM  3 weeks of progressive lower extremity edema with difficulty walking and intermittent shortness of breath.  Chest x-ray negative for infiltrate or edema. BMP 588. Dopplers  negative for DVTs. EKG is unchanged with inferior lateral ST depressions and T-wave inversions.  Patient presentation discussed with her PCP Dr. Record. He states patient has had issues in the past with leg swelling and has been on Lasix intermittently. Last echo in 2012 she diastolic dysfunction.  Her grafts were patent on catheterization 2006.  He agrees patient is reliable for followup as she is no respiratory issues or chest pain she can be  discharge and a small dose of Lasix.  Patient required much assistance to ambulate. Discussed with P.J. case manager who is seen patient and confirmed the home care resources available.  However CT positive for multiple small pulmonary emboli. Lovenox ordered and admission d/w Dr. Gwenlyn Perking.    Date: 03/02/2013  Rate: 68  Rhythm: normal sinus rhythm  QRS Axis: normal  Intervals: normal  ST/T Wave abnormalities: nonspecific ST/T changes  Conduction Disutrbances:none  Narrative Interpretation: Unchanged inferior lateral T-wave inversions  Old EKG Reviewed: unchanged    Glynn Octave, MD 03/02/13 [phone]

## 2013-03-02 NOTE — ED Notes (Signed)
Case management at bedside.

## 2013-03-02 NOTE — ED Notes (Signed)
Per EMS pt came from home c/o increased bilateral edema to lower extremities and intermittent SOB x3 weeks. At this time pt is not SOB, only c/o leg discomfort. Pt no longer taking fluid pill. +3 pitting edema to lower extremities.

## 2013-03-02 NOTE — H&P (Signed)
[facility] History and Physical  Kayla Ramos ZOX:[id number] DOB: November 05, 1924 DOA: 03/02/2013  Referring physician: Dr. Manus Gunning PCP: Dr. Record    Chief Complaint: Leg swelling, weakness  HPI: Kayla Ramos is a 79 y.o. female with past medical history significant for hypertension, coronary artery disease status post CABG (in 2000), hyperlipidemia, Aortic valve replacement (porcine valve, done in 2000) and TIA; came to the hospital complaining of increased lower extremity swelling and weakness. Patient reports that over the last 5 days she had noticed increased bilateral lower extremity swelling and also pain affecting right more than left. Due to these abnormalities she has been more sedentary than usual. Patient endorses mild disc: 4 of her chest that went away and she did not pay much attention to it (happens about 3-4 days prior to admission). Patient reports that she has experienced intermittent swelling of her legs but that she never has been disabled completing her activities of daily living and she feels now. Patient denies fever, chills, headache, blurred vision, abdominal pain, nausea/vomiting, hematemesis, melena, dysuria, significant shortness of breath or orthopnea, diaphoresis and palpitations. In the ED lower extremities Dopplers demonstrated no DVTs, SVTs or Baker's cyst bilaterally; CT angio of her chest demonstrated a small bilateral pulmonary embolisms; she also had elevated BNP. Chest x-ray rule out any acute infiltrate or pulmonary edema.  [facility] he has been called to admit patient for further evaluation and treatment.  Review of Systems:  Negative except as mentioned otherwise on history of present illness.   Past Medical History  Diagnosis Date  . Hypertension   . Vertigo   . CAD (coronary artery disease) 2000    s/p CABG   . Hyperlipidemia   . Chronic back pain   . Scoliosis   . Vertebral artery stenosis 2006    75-90% per MR/MRA in 2006   . S/P  aortic valve repair 2000   Past Surgical History  Procedure Laterality Date  . Coronary artery bypass graft    . Cholecystectomy    . Valve replacement     Social History:  reports that she has never smoked. She does not have any smokeless tobacco history on file. She reports that she does not drink alcohol or use illicit drugs. lives at home, reports needing some assistance with her activities of daily living.   No Known Allergies  Family history: Significant for heart problems, hypertension and cholesterol. Otherwise noncontributory  Prior to Admission medications   Medication Sig Start Date End Date Taking? Authorizing Provider  atorvastatin (LIPITOR) 40 MG tablet Take 40 mg by mouth daily.     Yes Historical Provider, MD  clopidogrel (PLAVIX) 75 MG tablet Take 75 mg by mouth daily.   Yes Historical Provider, MD  diazepam (VALIUM) 5 MG tablet Take 5 mg by mouth 2 (two) times daily as needed. For anxiety   Yes Historical Provider, MD  felodipine (PLENDIL) 5 MG 24 hr tablet Take 5 mg by mouth daily.     Yes Historical Provider, MD  gabapentin (NEURONTIN) 300 MG capsule Take 300-600 mg by mouth 2 (two) times daily. Take 2 capsules in the morning and 1 capsule at night   Yes Historical Provider, MD  HYDROcodone-acetaminophen (NORCO) 7.5-325 MG per tablet Take 1 tablet by mouth every 8 (eight) hours as needed. For pain 10/08/11  Yes Kathlen Mody, MD  lisinopril (PRINIVIL,ZESTRIL) 20 MG tablet Take 20 mg by mouth daily.  09/10/11  Yes Rhetta Mura, MD  meclizine (ANTIVERT) 25 MG  tablet Take 25 mg by mouth 3 (three) times daily. For dizziness   Yes Historical Provider, MD  meloxicam (MOBIC) 7.5 MG tablet Take 7.5 mg by mouth daily.   Yes Historical Provider, MD   Physical Exam: Filed Vitals:   03/02/13 1515 03/02/13 1545 03/02/13 1600 03/02/13 1700  BP: 152/67 135/60 162/77   Pulse: 72 58 63   Temp:      TempSrc:      Resp: 14 16 18    Height:    4\' 11"  (1.499 m)  Weight:     65.318 kg (144 lb)  SpO2: 98% 96% 96%      General:  No acute distress, speaking in full sentences. Denies chest pain; good oxygen saturation on room air.  Eyes: PERRLA, extraocular muscles intact, no icterus, no nystagmus.  ENT: Moist mucous membranes, no erythema or exudate inside her mouth; no drainage out of her ears or nostrils  Neck: Supple, no JVD, no bruits  Cardiovascular: S1 and S2 appreciated, regular rate, positive soft murmur, no rubs or gallops  Respiratory: Good air movement, no wheezing or crackles.  Abdomen: Soft, nontender, nondistended, positive bowel sounds  Skin: No rash, no petechiae  Musculoskeletal: 1-2+ edema bilateral affecting lower extremities, no cyanosis, no joint swelling  Psychiatric: Stable. No suicidal ideation or hallucinations  Neurologic: Alert, awake and oriented x3, cranial nerve intact, upper extremities bilaterally with muscle strength 5 out of 5; lower extremities bilaterally with a muscle strength of 3-4/5 secondary to poor effort.  Labs on Admission:  Basic Metabolic Panel:  Recent Labs Lab 03/02/13 1151  NA 137  K 4.3  CL 104  CO2 25  GLUCOSE 126*  BUN 22  CREATININE 1.03  CALCIUM 9.0   Liver Function Tests:  Recent Labs Lab 03/02/13 1151  AST 22  ALT 13  ALKPHOS 78  BILITOT 0.9  PROT 6.8  ALBUMIN 3.6   CBC:  Recent Labs Lab 03/02/13 1151  WBC 8.0  NEUTROABS 6.7  HGB 13.5  HCT 38.6  MCV 87.7  PLT 213   Cardiac Enzymes:  Recent Labs Lab 03/02/13 1235  TROPONINI <0.30    BNP (last 3 results)  Recent Labs  03/02/13 1235  PROBNP 588.1*   Radiological Exams on Admission: Dg Chest 2 View  03/02/2013  *RADIOLOGY REPORT*  Clinical Data: Bilateral leg swelling, confusion  CHEST - 2 VIEW  Comparison: 10/07/2011  Findings: Cardiomegaly again noted.  Status post median sternotomy. Atherosclerotic calcifications of thoracic aorta.  Hiatal hernia. Stable elevation of the right hemidiaphragm.  No acute  infiltrate or pulmonary edema.  Osteopenia and degenerative changes thoracic spine.  IMPRESSION:  Status post median sternotomy.  Atherosclerotic calcifications of thoracic aorta.  Hiatal hernia.  Stable elevation of the right hemidiaphragm.  No acute infiltrate or pulmonary edema.  Osteopenia and degenerative changes thoracic spine.   Original Report Authenticated By: Natasha Mead, M.D.    Ct Angio Chest Pe W/cm &/or Wo Cm  03/02/2013  *RADIOLOGY REPORT*  Clinical Data: Shortness of breath.  Bilateral leg swelling.  CT ANGIOGRAPHY CHEST  Technique:  Multidetector CT imaging of the chest using the standard protocol during bolus administration of intravenous contrast. Multiplanar reconstructed images including MIPs were obtained and reviewed to evaluate the vascular anatomy.  Contrast: OMNIPAQUE IOHEXOL 350 MG/ML SOLN  Comparison: Chest radiographs obtained earlier today.  Findings: Post CABG changes.  Atheromatous arterial calcifications, including the coronary arteries.  Small amount of linear atelectasis or scarring in the left lower lobe.  Mild peripheral bullous changes bilaterally.  Small bilateral lower lobe pulmonary arterial filling defects.  No lung masses or enlarged lymph nodes.  Moderate-sized hiatal hernia. Unremarkable upper abdomen.  Thoracic spine degenerative changes.  IMPRESSION:  1.  Small number of small bilateral lower lobe pulmonary emboli. 2.  Moderate-sized hiatal hernia.  Critical Value/emergent results were called by telephone at the time of interpretation on 03/02/2013 at 1650 hours to Dr. Manus Gunning, who verbally acknowledged these results.   Original Report Authenticated By: Beckie Salts, M.D.     EKG:  Rate: 68  Rhythm: normal sinus rhythm  QRS Axis: normal  Intervals: normal  ST/T Wave abnormalities: nonspecific ST/T changes  Conduction Disutrbances:none  Narrative Interpretation: Unchanged inferior lateral T-wave inversions  Old EKG Reviewed:  unchanged   Assessment/Plan 1-bilateral lower extremity edema and mild shortness of breath: Most likely secondary to pulmonary embolism and venous insufficiency. -Lower extremities Dopplers done in the emergency department demonstrated negative DVT, Baker's cyst or SVTs bilateral. -A CT angio of the chest demonstrated a small bilateral pulmonary embolism -BNP mildly elevated -Will check 2-D echo and Cycle cardiac enzymes -Start patient on Lovenox and Coumadin per pharmacy -Case manager consulted for home health needs and also to evaluate if patient qualifies for anticoagulation with one of the new drugs (like that all to). -Plavix discontinue to decrease risk of bleeding  2-PE (pulmonary embolism): As mentioned above will check 2-D echo, start anticoagulation therapy.  3-TIA (transient ischemic attack): Will continue statins, patient will be protected with ongoing anticoagulation therapy. -If patient qualifies for several to [place] to will provide better protection for secondary prevention of TIA/stroke  4-HTN (hypertension): Stable. Will continue current antihypertensive regimen. Heart healthy diet.  5-S/P aortic valve repair: Will mL weight with 2-D echo. Patient without any chest pain.  6-CAD (coronary artery disease): No chest pain at present; but endorses mild discomfort couple of days prior to admission. Cycle cardiac enzymes, observe on telemetry and repeat 2-D echo.  7-Weakness generalized: Most likely associated with physical deconditioning with ongoing lower extremities swelling. -Physical therapy has been consulted for evaluation and treatment.  8-hyperlipidemia: Will check lipid panel. Continue statins  9-chronic diastolic heart failure: Grade 2 , with preserved ejection fraction 55-60% on 2-D echo of 2012.  -Will limit amount of fluids -Follow low sodium diet -daily weights and a strict I's and O's.  GI prophylaxis: Protonix  DVT prophylaxis: Will be on full dose  Lovenox and Coumadin per pharmacy   Code Status: Full Family Communication: Daughter at bedside Disposition Plan: admit to telemetry, inpatient; LOS > 2 midnights  Time spent: >30 minutes  Billye Nydam [facility] Pager [phone]  If 7PM-7AM, please contact night-coverage [email or web address] Password [email or web address] 03/02/2013, 6:01 PM

## 2013-03-02 NOTE — Progress Notes (Signed)
*  Preliminary Results* Bilateral lower extremity venous duplex completed. Bilateral lower extremities are negative for deep vein thrombosis. No evidence of Baker's cyst bilaterally.  03/02/2013 2:23 PM Gertie Fey, RDMS, RDCS

## 2013-03-02 NOTE — Consult Note (Addendum)
ANTICOAGULATION CONSULT NOTE - Initial Consult  Pharmacy Consult for Lovenox >> Coumadin Indication: pulmonary embolus  No Known Allergies  Patient Measurements: Height: 4\' 11"  (149.9 cm) Weight: 144 lb (65.318 kg) IBW/kg (Calculated) : 43.2 Heparin Dosing Weight: ~57kg  Vital Signs: Temp: 97.6 F (36.4 C) (04/29 1132) Temp src: Oral (04/29 1132) BP: 162/77 mmHg (04/29 1600) Pulse Rate: 63 (04/29 1600)  Labs:  Recent Labs  03/02/13 1151 03/02/13 1235  HGB 13.5  --   HCT 38.6  --   PLT 213  --   LABPROT 13.2  --   INR 1.01  --   CREATININE 1.03  --   TROPONINI  --  <0.30    Estimated Creatinine Clearance: 32.2 ml/min (by C-G formula based on Cr of 1.03).   Medical History: Past Medical History  Diagnosis Date  . Hypertension   . Vertigo   . CAD (coronary artery disease) 2000    s/p CABG   . Hyperlipidemia   . Chronic back pain   . Scoliosis   . Vertebral artery stenosis 2006    75-90% per MR/MRA in 2006   . S/P aortic valve repair 2000    Medications:  No anticoagulants pta  Assessment: 88yof presented to the ED with bilateral LE swelling and intermittent SOB x 3 weeks.LE dopplers negative for DVT. CT chest shows small bilateral lower lobe pulmonary emboli. She will begin IV heparin. Labs wnl.  Goal of Therapy:  Heparin level 0.3-0.7 units/ml Monitor platelets by anticoagulation protocol: Yes   Plan:  1) Heparin bolus 3000 units x1 2) Heparin drip at 900 units/hr 3) 8 hour heparin level 4) Daily heparin level and CBC  Fredrik Rigger 03/02/2013,5:02 PM  Now have received orders to change from heparin to lovenox. Will dose lovenox 65mg  sq q12.  Fredrik Rigger 03/02/2013, 5:36 PM  Patient known to pharmacy from anticoagulation initiation for bilateral PE, now to start on coumadin for chronic anticoagulation. INR is normal at baseline, no prior anticoagulants noted.  Plan: - Coumadin 3 mg PO x 1 today - Will f/up daily INR -  Coumadin book + video- will f/up to complete coumadin education prior to discharge  Thanks, Flint Hakeem K. Allena Katz, PharmD, BCPS.  Clinical Pharmacist Pager [phone]. 03/02/2013 7:14 PM

## 2013-03-02 NOTE — ED Notes (Signed)
Pt returned from radiology.

## 2013-03-02 NOTE — Progress Notes (Signed)
Patient has just arrived to unit from ED, report received from ED nurse.  Lorretta Harp RN

## 2013-03-02 NOTE — Progress Notes (Signed)
HOME HEALTH AGENCIES SERVING [place]   Agencies that are Medicare-Certified and are affiliated with [facility]  Telephone Number Address  [facility]   The [facility] has ownership interest in this company; however, you are under no obligation to use this agency. [phone] or  [phone] [address] [place], [place] [address]   Agencies that are Medicare-Certified and are not affiliated with [facility] Telephone Number Address  [facility] [phone] Fax [phone] [address] [place], [place]  [address]  [facility] [phone] or [phone] Fax [phone] [address] [place], [place] [address]  [facility] [phone] Fax [phone] [address] [place], [place] [address]  [facility] [phone] Fax [phone] [address] [place], [place]  [address]  [facility] [phone] Fax [phone] [address] [place], [place] [address]  [facility] [phone] [address] [place], [place] [address]  [facility] [phone]  [address] [place], [place] [address]  [facility] [phone] or [phone] Fax [phone] [address] [place], [place]  [address]  [facility] [phone] Fax [phone] [address] [place], [place]  [address]  [facility]  [phone] Fax [phone] [address] [place], [place] [address]               Agencies that are not Medicare-Certified and are not affiliated with [facility] Agency Telephone Number Address  [facility] [place] [phone] or [phone] Fax [phone] [address] [place]  [address]  [facility] [phone] Fax [phone] [address] [place], [place]  [address]  [facility]  [phone] Fax [phone] [address] [place], [place] [address]  [facility] [phone] Fax [phone] [address] [place], [place] [address]  [facility] [phone] or [phone] Fax [phone] [address] [place], [place]  [address]  [facility] [phone] or [phone] Fax [phone] [address], [address] [place], [place]  [address]  [facility] [phone] Fax [phone] [address] [place], [place]  [address]  [facility] [phone] [address] [place], [place]  [address]  [facility] [phone] Fax [phone] [address] [place], [place]  [address]  [facility] [phone] Fax [phone] [place], [place]  [address]  [facility] [phone] Fax [phone] [address] [place], [place] [address]  [facility] [phone] Fax [phone] [address] [place], [place]  [address]   In to offer choice of home health agencies per EDP request. [facility]. Appropriate referrals will be  made.

## 2013-03-03 DIAGNOSIS — I498 Other specified cardiac arrhythmias: Secondary | ICD-10-CM

## 2013-03-03 LAB — BASIC METABOLIC PANEL
BUN: 19 mg/dL (ref 6–23)
Chloride: 105 mEq/L (ref 96–112)
Creatinine, Ser: 1.05 mg/dL (ref 0.50–1.10)
Glucose, Bld: 88 mg/dL (ref 70–99)
Potassium: 3.6 mEq/L (ref 3.5–5.1)

## 2013-03-03 LAB — CBC
HCT: 38.6 % (ref 36.0–46.0)
Hemoglobin: 13.5 g/dL (ref 12.0–15.0)
MCH: 30.7 pg (ref 26.0–34.0)
MCHC: 35 g/dL (ref 30.0–36.0)

## 2013-03-03 LAB — TSH: TSH: 0.384 u[IU]/mL (ref 0.350–4.500)

## 2013-03-03 LAB — TROPONIN I: Troponin I: <0.3 ng/mL (ref <0.30)

## 2013-03-03 LAB — LIPID PANEL
LDL Cholesterol: 60 mg/dL (ref 0–99)
VLDL: 18 mg/dL (ref 0–40)

## 2013-03-03 MED ORDER — WARFARIN SODIUM 3 MG PO TABS
3.0000 mg | ORAL_TABLET | Freq: Once | ORAL | Status: AC
  Administered 2013-03-03: 3 mg via ORAL
  Filled 2013-03-03: qty 1

## 2013-03-03 MED ORDER — BIOTENE DRY MOUTH MT LIQD
15.0000 mL | Freq: Two times a day (BID) | OROMUCOSAL | Status: DC
  Administered 2013-03-03 – 2013-03-05 (×5): 15 mL via OROMUCOSAL

## 2013-03-03 MED ORDER — LISINOPRIL 10 MG PO TABS
10.0000 mg | ORAL_TABLET | Freq: Every day | ORAL | Status: DC
  Administered 2013-03-04 – 2013-03-05 (×2): 10 mg via ORAL
  Filled 2013-03-03 (×3): qty 1

## 2013-03-03 NOTE — Progress Notes (Signed)
[facility]  Patient Status: New  [facility] is providing the following services: RN - referral from the ED on 03/02/13  If patient discharges after hours, please call [phone].   [facility] 03/03/2013, 10:04 AM

## 2013-03-03 NOTE — Progress Notes (Signed)
   CARE MANAGEMENT ED NOTE 03/03/2013  Patient:  CHARITA, LINDENBERGER   Account Number:  [id number]  Date Initiated:  03/02/2013  Documentation initiated by:  Radford Pax  Subjective/Objective Assessment:   patient came to ED from home complaining of shortness of breath and lower extremity pain and swelling.     Subjective/Objective Assessment Detail:     Action/Plan:   Action/Plan Detail:   Anticipated DC Date:       Status Recommendation to Physician:   Result of Recommendation:    Other ED Services  Consult Working Plan    DC Planning Services  CM consult    Choice offered to / List presented to:  [name]         HH agency  [facility]    Status of service:    ED Comments:   ED Comments Detail:  03/03/13 1013am ED CM spoke to Ivonne Andrew CM on 4700 regarding plan to discharge using [facility] for home health needs.  03/02/13 1655pm Patient seen by ED case manager per EDP request for home health services.  Patient's daughter at bedside.  As per patient's daughter, patient does have a private  home health aide that stays with her from 7pm to 8am.  Home health agencies offered to patient and daughter.  Patient and daughter have selected [facility] for anticipated home health needs at discharge.

## 2013-03-03 NOTE — Progress Notes (Signed)
In to speak with pt. And granddaughter, Toniann Fail, about Xarelto.  Pt. Insurance does cover Xarelto, and pt.'s co-pay would be $30-45/30day supply.  Pt. States she uses [facility] in [place].  This NCM gave pt. The Xarelto 10day free card along with pt. Information.  NCM will continue to follow for dc needs.  Tera Mater, RN, BSN NCM [phone]

## 2013-03-03 NOTE — Evaluation (Signed)
Physical Therapy Evaluation Patient Details Name: Kayla Ramos MRN: [id number] DOB: 08-01-1925 Today's Date: 03/03/2013 Time: 4098-1191 PT Time Calculation (min): 1346 min  PT Assessment / Plan / Recommendation Clinical Impression  Pt is an 79 yo female admitted for R calf pain and LE swelling. Pt requires modA for all mobility and ADLs at this time. For safe d/c home as patient desires pt requires 24/7 supervision and assist and HHPT. Pt reports she can hire cargivers for 24/7 assist as she did before when she had her heart surgery. If 24/7 assist can not be provided pt will then need SNF placement.    PT Assessment  Patient needs continued PT services    Follow Up Recommendations  Home health PT;Supervision/Assistance - 24 hour    Does the patient have the potential to tolerate intense rehabilitation      Barriers to Discharge Decreased caregiver support;Inaccessible home environment pt needs to be able to do 6 steps with handrails in order to enter home    Equipment Recommendations  None recommended by PT (pt has all DME)    Recommendations for Other Services     Frequency Min 3X/week    Precautions / Restrictions Precautions Precautions: Fall Restrictions Weight Bearing Restrictions: No   Pertinent Vitals/Pain Pt reports R calf soreness      Mobility  Bed Mobility Bed Mobility: Supine to Sit;Sitting - Scoot to Edge of Bed Supine to Sit: 3: Mod assist;With rails Sitting - Scoot to Edge of Bed: 3: Mod assist;With rail Details for Bed Mobility Assistance: pt required assist for R LE mangement and trunk elevation, pt unable to scoot hips to EOB Transfers Transfers: Sit to Stand;Stand to Sit;Stand Pivot Transfers Sit to Stand: 4: Min assist;With upper extremity assist;From bed Stand to Sit: 4: Min assist;With upper extremity assist;To chair/3-in-1 Stand Pivot Transfers: 4: Min assist (with RW) Details for Transfer Assistance: significant increase in time, pt with  decreased R LE WBing tolerance, max v/c's for safety and hand placement in addition to walker management Ambulation/Gait Ambulation/Gait Assistance: Not tested (comment) (pt refused to amb further than chair due to fatigue)    Exercises     PT Diagnosis: Difficulty walking;Acute pain;Generalized weakness  PT Problem List: Decreased activity tolerance;Decreased balance;Decreased strength;Decreased mobility PT Treatment Interventions: Gait training;Stair training;Functional mobility training;Therapeutic activities;Therapeutic exercise   PT Goals Acute Rehab PT Goals PT Goal Formulation: With patient Time For Goal Achievement: 03/17/13 Potential to Achieve Goals: Good Pt will go Supine/Side to Sit: with supervision;with HOB not 0 degrees (comment degree);with rail PT Goal: Supine/Side to Sit - Progress: Goal set today Pt will go Sit to Stand: with supervision;with upper extremity assist (up to RW) PT Goal: Sit to Stand - Progress: Goal set today Pt will Transfer Bed to Chair/Chair to Bed: with supervision (with RW) PT Transfer Goal: Bed to Chair/Chair to Bed - Progress: Goal set today Pt will Ambulate: 16 - 50 feet;with min assist;with rolling walker PT Goal: Ambulate - Progress: Goal set today Pt will Go Up / Down Stairs: 6-9 stairs;with min assist;with rail(s) PT Goal: Up/Down Stairs - Progress: Goal set today  Visit Information  Last PT Received On: 03/03/13 Assistance Needed: +1    Subjective Data  Subjective: Pt received supine in bed with report "I"m so stiff." Patient Stated Goal: home   Prior Functioning  Home Living Lives With: Alone (does have caregiver nightly from 7pm-8am) Available Help at Discharge:  (pt reports she can hire 24/7 assist) Type of Home: House  Home Access: Stairs to enter [employer] of Steps: 5-6 Entrance Stairs-Rails: Can reach both Home Layout: One level Bathroom Shower/Tub: Engineer, manufacturing systems: Standard (but has  riser) Bathroom Accessibility: Yes How Accessible: Accessible via walker Home Adaptive Equipment: Bedside commode/3-in-1;Grab bars in shower;Raised toilet seat with rails;Shower chair with back;Walker - four wheeled;Hospital bed Prior Function Level of Independence: Needs assistance Needs Assistance: Bathing;Dressing Bath: Minimal (for the last 2 weeks) Dressing: Minimal (for the last 2 weeks) Able to Take Stairs?: Yes (with assist) Driving: Yes Vocation: Retired Comments: pt sleeps in hospital bed and was doing well, only had caregiver at night Communication Communication: No difficulties Dominant Hand: Right    Cognition  Cognition Arousal/Alertness: Awake/alert Behavior During Therapy: WFL for tasks assessed/performed Overall Cognitive Status: Within Functional Limits for tasks assessed    Extremity/Trunk Assessment Right Upper Extremity Assessment RUE ROM/Strength/Tone: [facility] for tasks assessed;Deficits RUE ROM/Strength/Tone Deficits: generalized weakness of 4/5 Left Upper Extremity Assessment LUE ROM/Strength/Tone: WFL for tasks assessed;Deficits LUE ROM/Strength/Tone Deficits: generalized weakness of 4/5 Right Lower Extremity Assessment RLE ROM/Strength/Tone: WFL for tasks assessed;Deficits RLE ROM/Strength/Tone Deficits: generalized weakness of 4/5 Left Lower Extremity Assessment LLE ROM/Strength/Tone: WFL for tasks assessed;Deficits LLE ROM/Strength/Tone Deficits: generalized weakness of 4/5 Trunk Assessment Trunk Assessment: Kyphotic (forward head)   Balance Balance Balance Assessed: Yes Static Sitting Balance Static Sitting - Balance Support: No upper extremity supported Static Sitting - Level of Assistance: 5: Stand by assistance Static Sitting - Comment/# of Minutes: 5 min  End of Session PT - End of Session Equipment Utilized During Treatment: Gait belt Activity Tolerance: Patient limited by fatigue Patient left: in chair;with call bell/phone within  reach Nurse Communication: Mobility status (d/c recs)  GP     Tamsin Nader Marie 03/03/2013, 4:20 PM  Lewis Shock, PT, DPT Pager #: [phone] Office #: [phone]

## 2013-03-03 NOTE — Consult Note (Signed)
ANTICOAGULATION CONSULT NOTE - Initial Consult  Pharmacy Consult for Lovenox >> Coumadin Indication: pulmonary embolus  No Known Allergies  Patient Measurements: Height: 4\' 11"  (149.9 cm) Weight: 144 lb 8 oz (65.545 kg) (bed scale) IBW/kg (Calculated) : 43.2 Heparin Dosing Weight: ~57kg  Vital Signs: Temp: 98.1 F (36.7 C) (04/30 0531) Temp src: Oral (04/30 0531) BP: 140/82 mmHg (04/30 0531) Pulse Rate: 54 (04/30 0531)  Labs:  Recent Labs  03/02/13 1151 03/02/13 1235 03/02/13 2002 03/03/13 0126 03/03/13 0127  HGB 13.5  --   --   --  13.5  HCT 38.6  --   --   --  38.6  PLT 213  --   --   --  224  LABPROT 13.2  --   --   --  13.4  INR 1.01  --   --   --  1.03  CREATININE 1.03  --   --   --  1.05  TROPONINI  --  <0.30 <0.30 <0.30  --     Estimated Creatinine Clearance: 31.6 ml/min (by C-G formula based on Cr of 1.05).  Medications:  No anticoagulants pta  Assessment: 88yof presented to the ED with bilateral LE swelling and intermittent SOB x 3 weeks.LE dopplers negative for DVT. CT chest shows small bilateral lower lobe pulmonary emboli. She continues on lovenox + coumadin. Lovenox dose is appropriate, INR remains subtherapeutic as expected after 1 dose of coumadin. CBC is WNL. No bleeding noted  Goal of Therapy:  INR 2-3 Anti-Xa level 0.6-1.2 units/ml 4hrs after LMWH dose given Monitor platelets by anticoagulation protocol: Yes   Plan:  1. Repeat coumadin 3mg  PO x 1 tonight 2. F/u AM INR 3. Continue lovenox 65mg  SQ Q12H - watch renal fxn closely 4. Continue CBC Q72H while on lovenox  Lysle Pearl, PharmD, BCPS Pager # [phone] 03/03/2013 9:19 AM

## 2013-03-03 NOTE — Progress Notes (Signed)
[facility] PROGRESS NOTE  Kayla Ramos:[id number] DOB: Mar 30, 1925 DOA: 03/02/2013 PCP: RECORD,CHARLES  Assessment/Plan: -bilateral lower extremity edema and mild shortness of breath: Most likely secondary to pulmonary embolism and venous insufficiency. Improved with anticoagulation  -Lower extremities Dopplers done in the emergency department demonstrated negative DVT, Baker's cyst or SVTs bilateral.  -A CT angio of the chest demonstrated a small bilateral pulmonary embolism  -BNP mildly elevated  -Will check 2-D echo which is currently pending and  cardiac enzymes negative -Started on lovenox but will place consult to pharmacy for xarelto. -Case manager consulted for home health needs and also to evaluate if patient qualifies for anticoagulation with one of the new drugs (like that all to).  -Plavix discontinue to decrease risk of bleeding   2-PE (pulmonary embolism): As mentioned above will check 2-D echo, start anticoagulation therapy.   3-TIA (transient ischemic attack): Will continue statins, patient will be protected with ongoing anticoagulation therapy.   4-HTN (hypertension): Stable. Given recorded soft blood pressures will decrease lisinopril.  5-S/P aortic valve repair: Will mL weight with 2-D echo. Patient without any chest pain.   6-CAD (coronary artery disease):  - cardiac enzymes negative - no red flags on telemetry monitoring.  7-Weakness generalized: Most likely associated with physical deconditioning with ongoing lower extremities swelling.  -Physical therapy has been consulted for evaluation and treatment.   8-hyperlipidemia:  - Continue statins   9-chronic diastolic heart failure: Grade 2 , with preserved ejection fraction 55-60% on 2-D echo of 2012.  -Will limit amount of fluids  -Follow low sodium diet  -daily weights and a strict I's and O's.  GI prophylaxis: Protonix    Code Status: full Family Communication: discussed with family and  patient at bedside.  Disposition Plan: Pending improvement in condition   Consultants:  Pharmacy consult  Procedures:  none  Antibiotics:  none  HPI/Subjective: No acute issues overnight. No new complaints.  Objective: Filed Vitals:   03/02/13 2042 03/03/13 0220 03/03/13 0531 03/03/13 1430  BP: 156/78 125/65 140/82 95/52  Pulse: 57 63 54 56  Temp: 97.9 F (36.6 C) 97.9 F (36.6 C) 98.1 F (36.7 C) 97.7 F (36.5 C)  TempSrc: Oral Oral Oral Oral  Resp: 18 17 20 20   Height:      Weight:   65.545 kg (144 lb 8 oz)   SpO2: 95% 94% 93% 92%    Intake/Output Summary (Last 24 hours) at 03/03/13 1617 Last data filed at 03/03/13 1300  Gross per 24 hour  Intake    240 ml  Output   1425 ml  Net  -1185 ml   Filed Weights   03/02/13 1700 03/02/13 1859 03/03/13 0531  Weight: 65.318 kg (144 lb) 65.8 kg (145 lb 1 oz) 65.545 kg (144 lb 8 oz)    Exam:   General:  Pt in NAD, Alert and Awake  Cardiovascular: RRR, No MRG  Respiratory: decrease breath sounds over bases, no wheezes  Abdomen: soft, NT, ND  Musculoskeletal: no cyanosis or clubbing   Data Reviewed: Basic Metabolic Panel:  Recent Labs Lab 03/02/13 1151 03/02/13 2002 03/03/13 0127  NA 137  --  144  K 4.3  --  3.6  CL 104  --  105  CO2 25  --  27  GLUCOSE 126*  --  88  BUN 22  --  19  CREATININE 1.03  --  1.05  CALCIUM 9.0  --  8.9  MG  --  2.1  --  Liver Function Tests:  Recent Labs Lab 03/02/13 1151 03/02/13 2002  AST 22 23  ALT 13 14  ALKPHOS 78 82  BILITOT 0.9 0.9  PROT 6.8 7.2  ALBUMIN 3.6 3.8   No results found for this basename: LIPASE, AMYLASE,  in the last 168 hours No results found for this basename: AMMONIA,  in the last 168 hours CBC:  Recent Labs Lab 03/02/13 1151 03/03/13 0127  WBC 8.0 5.3  NEUTROABS 6.7  --   HGB 13.5 13.5  HCT 38.6 38.6  MCV 87.7 87.7  PLT 213 224   Cardiac Enzymes:  Recent Labs Lab 03/02/13 1235 03/02/13 2002 03/03/13 0126  TROPONINI  <0.30 <0.30 <0.30   BNP (last 3 results)  Recent Labs  03/02/13 1235  PROBNP 588.1*   CBG: No results found for this basename: GLUCAP,  in the last 168 hours  No results found for this or any previous visit (from the past 240 hour(s)).   Studies: Dg Chest 2 View  03/02/2013  *RADIOLOGY REPORT*  Clinical Data: Bilateral leg swelling, confusion  CHEST - 2 VIEW  Comparison: 10/07/2011  Findings: Cardiomegaly again noted.  Status post median sternotomy. Atherosclerotic calcifications of thoracic aorta.  Hiatal hernia. Stable elevation of the right hemidiaphragm.  No acute infiltrate or pulmonary edema.  Osteopenia and degenerative changes thoracic spine.  IMPRESSION:  Status post median sternotomy.  Atherosclerotic calcifications of thoracic aorta.  Hiatal hernia.  Stable elevation of the right hemidiaphragm.  No acute infiltrate or pulmonary edema.  Osteopenia and degenerative changes thoracic spine.   Original Report Authenticated By: Natasha Mead, M.D.    Ct Angio Chest Pe W/cm &/or Wo Cm  03/02/2013  *RADIOLOGY REPORT*  Clinical Data: Shortness of breath.  Bilateral leg swelling.  CT ANGIOGRAPHY CHEST  Technique:  Multidetector CT imaging of the chest using the standard protocol during bolus administration of intravenous contrast. Multiplanar reconstructed images including MIPs were obtained and reviewed to evaluate the vascular anatomy.  Contrast: OMNIPAQUE IOHEXOL 350 MG/ML SOLN  Comparison: Chest radiographs obtained earlier today.  Findings: Post CABG changes.  Atheromatous arterial calcifications, including the coronary arteries.  Small amount of linear atelectasis or scarring in the left lower lobe.  Mild peripheral bullous changes bilaterally.  Small bilateral lower lobe pulmonary arterial filling defects.  No lung masses or enlarged lymph nodes.  Moderate-sized hiatal hernia. Unremarkable upper abdomen.  Thoracic spine degenerative changes.  IMPRESSION:  1.  Small number of small  bilateral lower lobe pulmonary emboli. 2.  Moderate-sized hiatal hernia.  Critical Value/emergent results were called by telephone at the time of interpretation on 03/02/2013 at 1650 hours to Dr. Manus Gunning, who verbally acknowledged these results.   Original Report Authenticated By: Beckie Salts, M.D.     Scheduled Meds: . antiseptic oral rinse  15 mL Mouth Rinse BID  . atorvastatin  40 mg Oral q1800  . enoxaparin (LOVENOX) injection  65 mg Subcutaneous Q12H  . felodipine  5 mg Oral Daily  . gabapentin  300 mg Oral QHS  . gabapentin  600 mg Oral Daily  . lisinopril  20 mg Oral Daily  . pantoprazole  40 mg Oral Q1200  . sodium chloride  3 mL Intravenous Q12H  . sodium chloride  3 mL Intravenous Q12H  . warfarin  3 mg Oral ONCE-1800  . Warfarin - Pharmacist Dosing Inpatient   Does not apply q1800   Continuous Infusions:   Principal Problem:   PE (pulmonary embolism) Active Problems:  TIA (transient ischemic attack)   HTN (hypertension)   S/P aortic valve repair   CAD (coronary artery disease)   Leg swelling   Weakness generalized    Time spent: > 35 minutes    Penny Pia  [facility] Pager [phone]. If 7PM-7AM, please contact night-coverage at [email or web address], password [facility] 03/03/2013, 4:17 PM  LOS: 1 day

## 2013-03-04 DIAGNOSIS — I369 Nonrheumatic tricuspid valve disorder, unspecified: Secondary | ICD-10-CM

## 2013-03-04 LAB — PROTIME-INR
INR: 1.25 (ref 0.00–1.49)
Prothrombin Time: 15.5 seconds — ABNORMAL HIGH (ref 11.6–15.2)

## 2013-03-04 LAB — CBC
Platelets: 217 10*3/uL (ref 150–400)
RDW: 14.2 % (ref 11.5–15.5)
WBC: 6.1 10*3/uL (ref 4.0–10.5)

## 2013-03-04 MED ORDER — WARFARIN SODIUM 5 MG PO TABS
5.0000 mg | ORAL_TABLET | Freq: Once | ORAL | Status: AC
  Administered 2013-03-04: 5 mg via ORAL
  Filled 2013-03-04: qty 1

## 2013-03-04 NOTE — Progress Notes (Signed)
ANTICOAGULATION CONSULT NOTE - Follow Up Consult  Pharmacy Consult for Lovenox and Coumadin Indication: pulmonary embolus  No Known Allergies  Patient Measurements: Height: 4\' 11"  (149.9 cm) Weight: 143 lb 11.8 oz (65.2 kg) (Scale C) IBW/kg (Calculated) : 43.2  Vital Signs: Temp: 97.6 F (36.4 C) (05/01 1300) Temp src: Oral (05/01 1300) BP: 133/60 mmHg (05/01 1300) Pulse Rate: 63 (05/01 1300)  Labs:  Recent Labs  03/02/13 1151 03/02/13 1235 03/02/13 2002 03/03/13 0126 03/03/13 0127 03/04/13 0545  HGB 13.5  --   --   --  13.5 13.0  HCT 38.6  --   --   --  38.6 38.1  PLT 213  --   --   --  224 217  LABPROT 13.2  --   --   --  13.4 15.5*  INR 1.01  --   --   --  1.03 1.25  CREATININE 1.03  --   --   --  1.05  --   TROPONINI  --  <0.30 <0.30 <0.30  --   --     Estimated Creatinine Clearance: 31.6 ml/min (by C-G formula based on Cr of 1.05).  Assessment:  Day # 3 of 5 minimum overlap Lovenox and Coumadin for new small bilateral lower lobe PE.  Dopplers negative for DVT.   Currently on Lovenox 65 mg SQ q12hrs.  Had had Coumadin 3 mg daily x 2 days, but slow increase in INR.  Discussed briefly with Dr. Cena Benton.  Considered for transition to Xarelto, but with Crcl ~30-35 ml/min, to continue with Lovenox and Coumadin.    Also at the borderline for needing Lovenox adjusted from q12hrs to q24hrs (for Crcl <30 ml/min).  Goal of Therapy:  INR 2-3 Anti-Xa level 0.6-1.2 units/ml 4hrs after LMWH dose given Monitor platelets by anticoagulation protocol: Yes   Plan:   Increase today's Coumadin dose to 5 mg x 1.  Continue Lovenox 65 mg SQ q12hrs for now.  PT/INR, CBC and bmet in am.  Dennie Fetters, RPh Pager: [phone] 03/04/2013,2:39 PM

## 2013-03-04 NOTE — Progress Notes (Signed)
[facility] PROGRESS NOTE  Kayla Ramos:[id number] DOB: Dec 13, 1924 DOA: 03/02/2013 PCP: RECORD,CHARLES  Assessment/Plan: -bilateral lower extremity edema and mild shortness of breath: Most likely secondary to pulmonary embolism and venous insufficiency. Improved with anticoagulation  -Lower extremities Dopplers done in the emergency department demonstrated negative DVT, Baker's cyst or SVTs bilateral.  -A CT angio of the chest demonstrated a small bilateral pulmonary embolism  -BNP mildly elevated  -Will check 2-D echo which is currently pending and  cardiac enzymes negative -Given creatinine clearance being at baseline xarelto is risky at this point and as such will go ahead and continue patient on lovenox and coumadin. -Case manager consulted for home health needs and also to evaluate if patient qualifies for anticoagulation with one of the new drugs (like that all to).  -Plavix discontinue to decrease risk of bleeding   2-PE (pulmonary embolism): As mentioned above will check 2-D echo, start anticoagulation therapy with coumadin and lovenox  3-TIA (transient ischemic attack): Will continue statins, patient will be protected with ongoing anticoagulation therapy.   4-HTN (hypertension): Stable. Given recorded soft blood pressures will decrease lisinopril.  5-S/P aortic valve repair: Evaluate further with 2-D echo. Patient without any chest pain.   6-CAD (coronary artery disease):  - cardiac enzymes negative - no red flags on telemetry monitoring.  7-Weakness generalized: Most likely associated with physical deconditioning with ongoing lower extremities swelling.  -Physical therapy has been consulted for evaluation and treatment.   8-hyperlipidemia:  - Continue statins   9-chronic diastolic heart failure: Grade 2 , with preserved ejection fraction 55-60% on 2-D echo of 2012.  -Will limit amount of fluids  -Follow low sodium diet  -daily weights and a strict I's and O's.   GI prophylaxis: Protonix    Code Status: full Family Communication: discussed with family and patient at bedside.  Disposition Plan: Pending improvement in condition   Consultants:  Pharmacy consult  Procedures:  none  Antibiotics:  none  HPI/Subjective: No acute issues overnight. No new complaints.  Objective: Filed Vitals:   03/04/13 0502 03/04/13 0700 03/04/13 1012 03/04/13 1300  BP: 165/91 156/63 137/59 133/60  Pulse: 60 54 63 63  Temp: 98.1 F (36.7 C)  98.6 F (37 C) 97.6 F (36.4 C)  TempSrc: Oral  Oral Oral  Resp: 19  18 20   Height:      Weight: 65.2 kg (143 lb 11.8 oz)     SpO2: 92%  95% 94%    Intake/Output Summary (Last 24 hours) at 03/04/13 1507 Last data filed at 03/04/13 1300  Gross per 24 hour  Intake   1083 ml  Output    725 ml  Net    358 ml   Filed Weights   03/02/13 1859 03/03/13 0531 03/04/13 0502  Weight: 65.8 kg (145 lb 1 oz) 65.545 kg (144 lb 8 oz) 65.2 kg (143 lb 11.8 oz)    Exam:   General:  Pt in NAD, Alert and Awake  Cardiovascular: RRR, No MRG  Respiratory: decrease breath sounds over bases, no wheezes  Abdomen: soft, NT, ND  Musculoskeletal: no cyanosis or clubbing   Data Reviewed: Basic Metabolic Panel:  Recent Labs Lab 03/02/13 1151 03/02/13 2002 03/03/13 0127  NA 137  --  144  K 4.3  --  3.6  CL 104  --  105  CO2 25  --  27  GLUCOSE 126*  --  88  BUN 22  --  19  CREATININE 1.03  --  1.05  CALCIUM 9.0  --  8.9  MG  --  2.1  --    Liver Function Tests:  Recent Labs Lab 03/02/13 1151 03/02/13 2002  AST 22 23  ALT 13 14  ALKPHOS 78 82  BILITOT 0.9 0.9  PROT 6.8 7.2  ALBUMIN 3.6 3.8   No results found for this basename: LIPASE, AMYLASE,  in the last 168 hours No results found for this basename: AMMONIA,  in the last 168 hours CBC:  Recent Labs Lab 03/02/13 1151 03/03/13 0127 03/04/13 0545  WBC 8.0 5.3 6.1  NEUTROABS 6.7  --   --   HGB 13.5 13.5 13.0  HCT 38.6 38.6 38.1  MCV 87.7  87.7 88.6  PLT 213 224 217   Cardiac Enzymes:  Recent Labs Lab 03/02/13 1235 03/02/13 2002 03/03/13 0126  TROPONINI <0.30 <0.30 <0.30   BNP (last 3 results)  Recent Labs  03/02/13 1235  PROBNP 588.1*   CBG: No results found for this basename: GLUCAP,  in the last 168 hours  No results found for this or any previous visit (from the past 240 hour(s)).   Studies: Ct Angio Chest Pe W/cm &/or Wo Cm  03/02/2013  *RADIOLOGY REPORT*  Clinical Data: Shortness of breath.  Bilateral leg swelling.  CT ANGIOGRAPHY CHEST  Technique:  Multidetector CT imaging of the chest using the standard protocol during bolus administration of intravenous contrast. Multiplanar reconstructed images including MIPs were obtained and reviewed to evaluate the vascular anatomy.  Contrast: OMNIPAQUE IOHEXOL 350 MG/ML SOLN  Comparison: Chest radiographs obtained earlier today.  Findings: Post CABG changes.  Atheromatous arterial calcifications, including the coronary arteries.  Small amount of linear atelectasis or scarring in the left lower lobe.  Mild peripheral bullous changes bilaterally.  Small bilateral lower lobe pulmonary arterial filling defects.  No lung masses or enlarged lymph nodes.  Moderate-sized hiatal hernia. Unremarkable upper abdomen.  Thoracic spine degenerative changes.  IMPRESSION:  1.  Small number of small bilateral lower lobe pulmonary emboli. 2.  Moderate-sized hiatal hernia.  Critical Value/emergent results were called by telephone at the time of interpretation on 03/02/2013 at 1650 hours to Dr. Manus Gunning, who verbally acknowledged these results.   Original Report Authenticated By: Beckie Salts, M.D.     Scheduled Meds: . antiseptic oral rinse  15 mL Mouth Rinse BID  . atorvastatin  40 mg Oral q1800  . enoxaparin (LOVENOX) injection  65 mg Subcutaneous Q12H  . felodipine  5 mg Oral Daily  . gabapentin  300 mg Oral QHS  . gabapentin  600 mg Oral Daily  . lisinopril  10 mg Oral Daily  .  pantoprazole  40 mg Oral Q1200  . sodium chloride  3 mL Intravenous Q12H  . sodium chloride  3 mL Intravenous Q12H  . warfarin  5 mg Oral ONCE-1800  . Warfarin - Pharmacist Dosing Inpatient   Does not apply q1800   Continuous Infusions:   Principal Problem:   PE (pulmonary embolism) Active Problems:   TIA (transient ischemic attack)   HTN (hypertension)   S/P aortic valve repair   CAD (coronary artery disease)   Leg swelling   Weakness generalized    Time spent: > 35 minutes    Penny Pia  [facility] Pager [phone]. If 7PM-7AM, please contact night-coverage at [email or web address], password [facility] 03/04/2013, 3:07 PM  LOS: 2 days

## 2013-03-04 NOTE — Progress Notes (Signed)
Physical Therapy Treatment Patient Details Name: Kayla Ramos MRN: [id number] DOB: [birth year] Today's Date: 03/04/2013 Time: 9147-8295 PT Time Calculation (min): 22 min  PT Assessment / Plan / Recommendation Comments on Treatment Session  Pt able to ambulate today but is progressing slowly.  Pt reports she has not ambulated since tuesday & occassionally uses BSC but also uses bed pan.  Encouraged pt to only use BSC or ambulate into bathroom instead of using bed pan for now on.   Spoke with Case manager who reports pt has will be home alone majority of day.  Cont to strongly recommend 24/7 (A) at d/c & if unavailable she will need ST-SNF.      Follow Up Recommendations  Home health PT;Supervision/Assistance - 24 hour; SNF     Does the patient have the potential to tolerate intense rehabilitation     Barriers to Discharge        Equipment Recommendations  None recommended by PT    Recommendations for Other Services    Frequency Min 3X/week   Plan Discharge plan remains appropriate    Precautions / Restrictions Precautions Precautions: Fall Restrictions Weight Bearing Restrictions: No       Mobility  Bed Mobility Bed Mobility: Supine to Sit;Sitting - Scoot to Edge of Bed Supine to Sit: 3: Mod assist;With rails;HOB elevated Sitting - Scoot to Edge of Bed: 3: Mod assist Details for Bed Mobility Assistance: (A) for LE's, to lift shoulders/trunk to sitting upright, & use of draw pad to scoot hips closer to EOB>  Transfers Transfers: Sit to Stand;Stand to Sit Sit to Stand: 4: Min assist;With upper extremity assist;From bed;From chair/3-in-1 Stand to Sit: 4: Min assist;With upper extremity assist;With armrests;To chair/3-in-1 Details for Transfer Assistance: cues for hand placement & technique.  (A) to achieve standing, balance, & controlled descent.   Ambulation/Gait Ambulation/Gait Assistance: 4: Min assist Ambulation Distance (Feet): 15 Feet (10' + 5' ) Assistive device:  Rolling walker Ambulation/Gait Assistance Details: Pt with very flexed posture, small shuffling steps, & increased effort.  Pt required lengthy seated rest break.    Gait Pattern: Step-through pattern;Decreased stride length;Trunk flexed;Shuffle General Gait Details: Faciliation for increase erect posture but unable to maintain while ambulating.   Stairs: No Wheelchair Mobility Wheelchair Mobility: No    Exercises     PT Diagnosis:    PT Problem List:   PT Treatment Interventions:     PT Goals Acute Rehab PT Goals Time For Goal Achievement: 03/17/13 Potential to Achieve Goals: Good Pt will go Supine/Side to Sit: with supervision;with HOB not 0 degrees (comment degree);with rail PT Goal: Supine/Side to Sit - Progress: Not met Pt will go Sit to Stand: with supervision;with upper extremity assist PT Goal: Sit to Stand - Progress: Not met Pt will Transfer Bed to Chair/Chair to Bed: with supervision Pt will Ambulate: 16 - 50 feet;with min assist;with rolling walker PT Goal: Ambulate - Progress: Progressing toward goal Pt will Go Up / Down Stairs: 6-9 stairs;with min assist;with rail(s)  Visit Information  Last PT Received On: 03/04/13 Assistance Needed: +1    Subjective Data  Subjective: "Im so stiff" Patient Stated Goal: home   Cognition  Cognition Arousal/Alertness: Awake/alert Behavior During Therapy: WFL for tasks assessed/performed Overall Cognitive Status: Within Functional Limits for tasks assessed    Balance     End of Session PT - End of Session Equipment Utilized During Treatment: Gait belt Activity Tolerance: Patient limited by fatigue Patient left: in chair;with call bell/phone within reach  Nurse Communication: Mobility status   GP     Lara Mulch 03/04/2013, 3:20 PM

## 2013-03-04 NOTE — Progress Notes (Signed)
03/04/13  1145 Noted PT recommends HH PT.  TC to Lupita Leash, with [facility] to add [facility] PT for home health services.  Pt. may dc home today.  Tera Mater, RN, BSN NCM [phone]

## 2013-03-04 NOTE — Progress Notes (Signed)
*  PRELIMINARY RESULTS* Echocardiogram 2D Echocardiogram has been performed.  Kayla Ramos 03/04/2013, 3:16 PM

## 2013-03-04 NOTE — Progress Notes (Signed)
Utilization Review Completed.   Yishai Rehfeld, RN, BSN Nurse Case Manager  [phone]  

## 2013-03-05 LAB — PROTIME-INR
INR: 1.84 — ABNORMAL HIGH (ref 0.00–1.49)
Prothrombin Time: 20.6 seconds — ABNORMAL HIGH (ref 11.6–15.2)

## 2013-03-05 LAB — BASIC METABOLIC PANEL
Calcium: 8.6 mg/dL (ref 8.4–10.5)
GFR calc non Af Amer: 47 mL/min — ABNORMAL LOW (ref >90)
Glucose, Bld: 99 mg/dL (ref 70–99)
Sodium: 139 mEq/L (ref 135–145)

## 2013-03-05 MED ORDER — GABAPENTIN 300 MG PO CAPS: 300.0000 mg | ORAL_CAPSULE | Freq: Every day | ORAL | Status: DC

## 2013-03-05 MED ORDER — WARFARIN SODIUM 3 MG PO TABS: 3.0000 mg | ORAL_TABLET | Freq: Once | ORAL | Status: DC

## 2013-03-05 MED ORDER — WARFARIN SODIUM 3 MG PO TABS
3.0000 mg | ORAL_TABLET | Freq: Once | ORAL | Status: AC
  Administered 2013-03-05: 3 mg via ORAL
  Filled 2013-03-05: qty 1

## 2013-03-05 MED ORDER — LISINOPRIL 10 MG PO TABS: 10.0000 mg | ORAL_TABLET | Freq: Every day | ORAL | Status: DC

## 2013-03-05 MED ORDER — ENOXAPARIN SODIUM 80 MG/0.8ML ~~LOC~~ SOLN: 65.0000 mg | Freq: Two times a day (BID) | SUBCUTANEOUS | Status: DC

## 2013-03-05 MED ORDER — DIAZEPAM 5 MG PO TABS: 2.5000 mg | ORAL_TABLET | Freq: Two times a day (BID) | ORAL | Status: DC | PRN

## 2013-03-05 NOTE — Progress Notes (Signed)
ANTICOAGULATION CONSULT NOTE - Follow Up Consult  Pharmacy Consult for Lovenox and Coumadin Indication: pulmonary embolus  No Known Allergies  Patient Measurements: Height: 4\' 11"  (149.9 cm) Weight: 144 lb 1.6 oz (65.363 kg) (scale C) IBW/kg (Calculated) : 43.2  Vital Signs: Temp: 98.1 F (36.7 C) (05/02 0903) Temp src: Oral (05/02 0903) BP: 139/58 mmHg (05/02 0903) Pulse Rate: 60 (05/02 0903)  Labs:  Recent Labs  03/02/13 1235 03/02/13 2002 03/03/13 0126 03/03/13 0127 03/04/13 0545 03/05/13 0634  HGB  --   --   --  13.5 13.0  --   HCT  --   --   --  38.6 38.1  --   PLT  --   --   --  224 217  --   LABPROT  --   --   --  13.4 15.5* 20.6*  INR  --   --   --  1.03 1.25 1.84*  CREATININE  --   --   --  1.05  --  1.05  TROPONINI <0.30 <0.30 <0.30  --   --   --     Estimated Creatinine Clearance: 31.6 ml/min (by C-G formula based on Cr of 1.05).  Assessment:  Day # 4 of 5 minimum overlap Lovenox and Coumadin for new small bilateral lower lobe PE.  Dopplers negative for DVT.   Currently on Lovenox 65 mg SQ q12hrs.  Has had Coumadin 3 mg daily x 2 days, then 5 mg on 5/1. INR now rising towards target range.  Had beeb considered for transition to Xarelto, but with Crcl ~30-35 ml/min, to continue with Lovenox and Coumadin.    Also at the borderline for needing Lovenox adjusted from q12hrs to q24hrs (for Crcl <30 ml/min), but today's Scr is unchanged from prior value.  Goal of Therapy:  INR 2-3 Anti-Xa level 0.6-1.2 units/ml 4hrs after LMWH dose given Monitor platelets by anticoagulation protocol: Yes   Plan:   Coumadin 3 mg today.  Continue Lovenox 65 mg SQ q12hrs.   Will need 2-day overlap with INR >2.  Daily PT/INR.  Dennie Fetters, [place] Pager: [phone] 03/05/2013,12:13 PM

## 2013-03-05 NOTE — Discharge Summary (Signed)
Physician Discharge Summary  Kayla Ramos ZOX:[id number] DOB: Jun 30, 1925 DOA: 03/02/2013  PCP: RECORD,CHARLES  Admit date: 03/02/2013 Discharge date: 03/05/2013  Time spent: > 35 minutes  Recommendations for Outpatient Follow-up:  1. Complete warfarin bridging. Pt to continue lovenox once INR at target range (2-3) for 24 hours 2. Monitor INR levels daily  Discharge Diagnoses:  Principal Problem:   PE (pulmonary embolism) Active Problems:   TIA (transient ischemic attack)   HTN (hypertension)   S/P aortic valve repair   CAD (coronary artery disease)   Leg swelling   Weakness generalized   Discharge Condition: Stable  Diet recommendation: Low sodium heart healthy  Filed Weights   03/03/13 0531 03/04/13 0502 03/05/13 0616  Weight: 65.545 kg (144 lb 8 oz) 65.2 kg (143 lb 11.8 oz) 65.363 kg (144 lb 1.6 oz)    History of present illness:  From original HPI: Kayla Ramos is a 79 y.o. female with past medical history significant for hypertension, coronary artery disease status post CABG (in 2000), hyperlipidemia, Aortic valve replacement (porcine valve, done in 2000) and TIA; came to the hospital complaining of increased lower extremity swelling and weakness. Patient reports that over the last 5 days she had noticed increased bilateral lower extremity swelling and also pain affecting right more than left. Due to these abnormalities she has been more sedentary than usual. Patient endorses mild disc: 4 of her chest that went away and she did not pay much attention to it (happens about 3-4 days prior to admission). Patient reports that she has experienced intermittent swelling of her legs but that she never has been disabled completing her activities of daily living and she feels now. Patient denies fever, chills, headache, blurred vision, abdominal pain, nausea/vomiting, hematemesis, melena, dysuria, significant shortness of breath or orthopnea, diaphoresis and palpitations.  In the ED lower  extremities Dopplers demonstrated no DVTs, SVTs or Baker's cyst bilaterally; CT angio of her chest demonstrated a small bilateral pulmonary embolisms; she also had elevated BNP. Chest x-ray rule out any acute infiltrate or pulmonary edema.  [facility] has been called to admit patient for further evaluation and treatment.   Hospital Course:  1. bilateral lower extremity edema and mild shortness of breath: Most likely secondary to pulmonary embolism and venous insufficiency. Improved with anticoagulation  -Lower extremities Dopplers done in the emergency department demonstrated negative DVT, Baker's cyst or SVTs bilateral.  -A CT angio of the chest demonstrated a small bilateral pulmonary embolism  -BNP mildly elevated  -Will check 2-D echo which is currently pending and cardiac enzymes negative  -Given creatinine clearance being at baseline xarelto is risky at this point and as such will go ahead and continue patient on lovenox and coumadin.  -Plavix discontinue to decrease risk of bleeding   2-PE (pulmonary embolism): 2 D echocardiogram shows ef of 45-50 percent with grade 1 diastolic dysfunction and hypokinesis of the entire anteroseptal myocardium. Continue anticoagulation therapy with coumadin and lovenox. - Bridging to be completed at nursing home. Once patient at goal INR of 2-3 for 24 hours may discontinue lovenox and continue warfarin with pharmacy at SNF monitoring and adjusting warfarin dose.  3-TIA (transient ischemic attack): Will continue statins, patient will be protected with ongoing anticoagulation therapy.   4-HTN (hypertension): Stable. Given recorded soft blood pressures lisinopril decreased while in house.   5-S/P aortic valve repair: Evaluate further with 2-D echo. Patient without any chest pain.   6-CAD (coronary artery disease):  - cardiac enzymes negative  -  no red flags on telemetry monitoring.   7-Weakness generalized: Most likely associated with physical  deconditioning with ongoing lower extremities swelling.  -Physical therapy has discussed finding with me and recommends SNF for continued physical therapy while at facility.   8-hyperlipidemia:  - Continue statins   9-chronic diastolic heart failure: Grade 2 , with preserved ejection fraction 55-60% on 2-D echo of 2012.  -Continue low sodium diet  -compensated at day of discharge. PCP to continue to monitor as outpatient.   Procedures:  Echocardiogram  CT angiogram of chest.  Consultations:  Pharmacy consult for warfarin  Discharge Exam: Filed Vitals:   03/04/13 1718 03/04/13 2036 03/05/13 0616 03/05/13 0903  BP: 116/53 110/55 156/58 139/58  Pulse: 58 59 51 60  Temp: 98.1 F (36.7 C) 97.8 F (36.6 C) 98.4 F (36.9 C) 98.1 F (36.7 C)  TempSrc: Oral Oral Oral Oral  Resp: 16 18 18 18   Height:      Weight:   65.363 kg (144 lb 1.6 oz)   SpO2: 95% 95% 96% 97%    General: Pt in NAD, Alert and Awake Cardiovascular: RRR, NO MRG Respiratory: CTA BL, no wheezes, no increased wob  Discharge Instructions  Discharge Orders   Future Orders Complete By Expires     Call MD for:  persistant dizziness or light-headedness  As directed     Call MD for:  severe uncontrolled pain  As directed     Call MD for:  temperature >100.4  As directed     Diet - low sodium heart healthy  As directed     Discharge instructions  As directed     Comments:      Continue lovenox with coumadin bridging.  Pharmacy at facility to continue monitoring INR and dose coumadin per warfarin protocol with goal INR of 2-3.  Once INR at goal for 24 hours concurrent lovenox can be discontinued and patient can continue anticoagulation with warfarin solely.   Please have SNF see patient within the next week.    Increase activity slowly  As directed         Medication List    STOP taking these medications       clopidogrel 75 MG tablet  Commonly known as:  PLAVIX     meclizine 25 MG tablet  Commonly  known as:  ANTIVERT     meloxicam 7.5 MG tablet  Commonly known as:  MOBIC      TAKE these medications       atorvastatin 40 MG tablet  Commonly known as:  LIPITOR  Take 40 mg by mouth daily.     diazepam 5 MG tablet  Commonly known as:  VALIUM  Take 0.5 tablets (2.5 mg total) by mouth 2 (two) times daily as needed for anxiety.     enoxaparin 80 MG/0.8ML injection  Commonly known as:  LOVENOX  Inject 0.65 mLs (65 mg total) into the skin every 12 (twelve) hours.     felodipine 5 MG 24 hr tablet  Commonly known as:  PLENDIL  Take 5 mg by mouth daily.     gabapentin 300 MG capsule  Commonly known as:  NEURONTIN  Take 1 capsule (300 mg total) by mouth at bedtime.     HYDROcodone-acetaminophen 7.5-325 MG per tablet  Commonly known as:  NORCO  Take 1 tablet by mouth every 8 (eight) hours as needed. For pain     lisinopril 10 MG tablet  Commonly known as:  PRINIVIL,ZESTRIL  Take  1 tablet (10 mg total) by mouth daily.     warfarin 3 MG tablet  Commonly known as:  COUMADIN  Take 1 tablet (3 mg total) by mouth one time only at 6 PM.       No Known Allergies     Follow-up Information   Follow up with [facility]. (Home health Nurse and Physical Therapy)    Contact information:   [phone]       The results of significant diagnostics from this hospitalization (including imaging, microbiology, ancillary and laboratory) are listed below for reference.    Significant Diagnostic Studies: Dg Chest 2 View  03/02/2013  *RADIOLOGY REPORT*  Clinical Data: Bilateral leg swelling, confusion  CHEST - 2 VIEW  Comparison: 10/07/2011  Findings: Cardiomegaly again noted.  Status post median sternotomy. Atherosclerotic calcifications of thoracic aorta.  Hiatal hernia. Stable elevation of the right hemidiaphragm.  No acute infiltrate or pulmonary edema.  Osteopenia and degenerative changes thoracic spine.  IMPRESSION:  Status post median sternotomy.  Atherosclerotic  calcifications of thoracic aorta.  Hiatal hernia.  Stable elevation of the right hemidiaphragm.  No acute infiltrate or pulmonary edema.  Osteopenia and degenerative changes thoracic spine.   Original Report Authenticated By: Natasha Mead, M.D.    Ct Angio Chest Pe W/cm &/or Wo Cm  03/02/2013  *RADIOLOGY REPORT*  Clinical Data: Shortness of breath.  Bilateral leg swelling.  CT ANGIOGRAPHY CHEST  Technique:  Multidetector CT imaging of the chest using the standard protocol during bolus administration of intravenous contrast. Multiplanar reconstructed images including MIPs were obtained and reviewed to evaluate the vascular anatomy.  Contrast: OMNIPAQUE IOHEXOL 350 MG/ML SOLN  Comparison: Chest radiographs obtained earlier today.  Findings: Post CABG changes.  Atheromatous arterial calcifications, including the coronary arteries.  Small amount of linear atelectasis or scarring in the left lower lobe.  Mild peripheral bullous changes bilaterally.  Small bilateral lower lobe pulmonary arterial filling defects.  No lung masses or enlarged lymph nodes.  Moderate-sized hiatal hernia. Unremarkable upper abdomen.  Thoracic spine degenerative changes.  IMPRESSION:  1.  Small number of small bilateral lower lobe pulmonary emboli. 2.  Moderate-sized hiatal hernia.  Critical Value/emergent results were called by telephone at the time of interpretation on 03/02/2013 at 1650 hours to Dr. Manus Gunning, who verbally acknowledged these results.   Original Report Authenticated By: Beckie Salts, M.D.     Microbiology: No results found for this or any previous visit (from the past 240 hour(s)).   Labs: Basic Metabolic Panel:  Recent Labs Lab 03/02/13 1151 03/02/13 2002 03/03/13 0127 03/05/13 0634  NA 137  --  144 139  K 4.3  --  3.6 3.9  CL 104  --  105 103  CO2 25  --  27 24  GLUCOSE 126*  --  88 99  BUN 22  --  19 16  CREATININE 1.03  --  1.05 1.05  CALCIUM 9.0  --  8.9 8.6  MG  --  2.1  --   --    Liver  Function Tests:  Recent Labs Lab 03/02/13 1151 03/02/13 2002  AST 22 23  ALT 13 14  ALKPHOS 78 82  BILITOT 0.9 0.9  PROT 6.8 7.2  ALBUMIN 3.6 3.8   No results found for this basename: LIPASE, AMYLASE,  in the last 168 hours No results found for this basename: AMMONIA,  in the last 168 hours CBC:  Recent Labs Lab 03/02/13 1151 03/03/13 0127 03/04/13 0545  WBC  8.0 5.3 6.1  NEUTROABS 6.7  --   --   HGB 13.5 13.5 13.0  HCT 38.6 38.6 38.1  MCV 87.7 87.7 88.6  PLT 213 224 217   Cardiac Enzymes:  Recent Labs Lab 03/02/13 1235 03/02/13 2002 03/03/13 0126  TROPONINI <0.30 <0.30 <0.30   BNP: BNP (last 3 results)  Recent Labs  03/02/13 1235  PROBNP 588.1*   CBG: No results found for this basename: GLUCAP,  in the last 168 hours     Signed:  Penny Pia  [facility] 03/05/2013, 1:34 PM

## 2013-03-05 NOTE — Progress Notes (Signed)
Agree with d/c update.  Chianne Byrns, PT DPT [phone]   

## 2013-03-05 NOTE — Progress Notes (Signed)
PT Progress Note:     03/05/13 1000  PT Visit Information  Last PT Received On 03/05/13  Assistance Needed +1  Subjective Data  Subjective "I know I need some rehab"  Patient Stated Goal home  Precautions  Precautions Fall  Restrictions  Weight Bearing Restrictions No  Cognition  Arousal/Alertness Awake/alert  Behavior During Therapy WFL for tasks assessed/performed  Overall Cognitive Status Within Functional Limits for tasks assessed  Bed Mobility  Bed Mobility Not assessed  Transfers  Transfers Sit to Stand;Stand to Sit  Sit to Stand 4: Min assist;With upper extremity assist;With armrests;From chair/3-in-1  Stand to Sit 4: Min assist;With upper extremity assist;With armrests;To chair/3-in-1  Details for Transfer Assistance (A) to achieve standing & control descent  Ambulation/Gait  Ambulation/Gait Assistance 4: Min guard  Ambulation Distance (Feet) 253 Feet  Assistive device Rolling walker  Ambulation/Gait Assistance Details Encouragement for tall posture.    Gait Pattern Step-through pattern;Decreased stride length;Shuffle;Trunk flexed  Gait velocity decreased  General Gait Details Faciliation for increase erect posture but unable to maintain while ambulating.    Stairs Yes  Stairs Assistance 4: Min assist  Stairs Assistance Details (indicate cue type and reason) (A) for balance & safety.  Educated on sideways technique due to pt can only reach 1 rail on front entrance (way she prefers to go into home)  Stair Management Technique One rail Right;Sideways;Step to pattern  Number of Stairs 3  Wheelchair Mobility  Wheelchair Mobility No  PT - End of Session  Equipment Utilized During Treatment Gait belt  Activity Tolerance Patient limited by fatigue  Patient left in chair;with call bell/phone within reach  Nurse Communication Mobility status  PT - Assessment/Plan  Comments on Treatment Session Pt would strongly benefit from ST-SNF to maximize functional mobility at d/c.   She is home alone during day with caregiver during night.  She was independent PTA but now requiring (A) for all mobility at this date.     PT Plan Discharge plan needs to be updated  PT Frequency Min 3X/week  Follow Up Recommendations SNF;Supervision/Assistance - 24 hour  PT equipment None recommended by PT  Acute Rehab PT Goals  Time For Goal Achievement 03/17/13  Potential to Achieve Goals Good  Pt will go Supine/Side to Sit with supervision;with HOB not 0 degrees (comment degree);with rail  Pt will go Sit to Stand with supervision;with upper extremity assist  PT Goal: Sit to Stand - Progress Progressing toward goal  Pt will Transfer Bed to Chair/Chair to Bed with supervision  Pt will Ambulate 16 - 50 feet;with min assist;with rolling walker  PT Goal: Ambulate - Progress Progressing toward goal  Pt will Go Up / Down Stairs 6-9 stairs;with min assist;with rail(s)  PT Goal: Up/Down Stairs - Progress Progressing toward goal  PT General Charges  $$ ACUTE PT VISIT 1 Procedure  PT Treatments  $Gait Training 8-22 mins  $Therapeutic Activity 8-22 mins    Verdell Face, [place] [phone] 03/05/2013

## 2013-03-05 NOTE — Progress Notes (Signed)
Pt d/c med listed updated to include when next scheduled doses are due and sent along with pt packet to SNF.  IV's d/c'd.  Tele d/c'd.  Pt d/c'd to SNF.  Pt leaving the unit at this time via ambulance tx and appears in no acute distress. Nino Glow RN

## 2013-03-05 NOTE — Progress Notes (Signed)
Report given to nurse Ronni Rumble at [facility] facility. Lorretta Harp RN

## 2013-03-05 NOTE — Progress Notes (Signed)
Patient's IV and telemetry has been discontinued, patient is being discharged to [facility]. Discharge instruction will be sent via EMT with patient to facility. Lorretta Harp RN

## 2013-03-07 NOTE — Clinical Social Work Placement (Addendum)
    Clinical Social Work Department CLINICAL SOCIAL WORK PLACEMENT NOTE 03/07/2013  Patient:  Kayla Ramos, Kayla Ramos  Account Number:  [id number] Admit date:  03/02/2013  Clinical Social Worker:  Lupita Leash Kooper Godshall, LCSWA  Date/time:  03/07/2013 05:00 PM  Clinical Social Work is seeking post-discharge placement for this patient at the following level of care:   SKILLED NURSING   (*CSW will update this form in Epic as items are completed)   03/05/2013  Patient/family provided with [facility] Department of Clinical Social Work's list of facilities offering this level of care within the geographic area requested by the patient (or if unable, by the patient's family).  03/05/2013  Patient/family informed of their freedom to choose among providers that offer the needed level of care, that participate in Medicare, Medicaid or managed care program needed by the patient, have an available bed and are willing to accept the patient.  03/05/2013  Patient/family informed of [facility]' ownership interest in [facility], as well as of the fact that they are under no obligation to receive care at this facility.  PASARR submitted to EDS on 03/05/2013 PASARR number received from EDS on 03/05/2013  FL2 transmitted to all facilities in geographic area requested by pt/family on  03/05/2013 FL2 transmitted to all facilities within larger geographic area on   Patient informed that his/her managed care company has contracts with or will negotiate with  certain facilities, including the following:   [employer]- Carollee Herter [facility]     Patient/family informed of bed offers received:  03/05/2013 Patient chooses bed at [facility] Physician recommends and patient chooses bed at    Patient to be transferred to [facility] on  03/05/2013 Patient to be transferred to facility by Ambulance  [facility])  The following physician request were entered in  Epic:   Additional Comments: DC to SNF today . Patient and granddaughter are pleased wtih d/c plan Notified. SNF and pt's nurse of d/c. No further CSW needs identified.  CSW signing off.  Kayla Frederick. Khylon Ramos, LCSWA  [phone]

## 2013-03-07 NOTE — Clinical Social Work Psychosocial (Addendum)
    Clinical Social Work Department BRIEF PSYCHOSOCIAL ASSESSMENT 03/07/2013  Patient:  Kayla Ramos, Kayla Ramos     Account Number:  [id number]     Admit date:  03/02/2013  Clinical Social Worker:  Tiburcio Pea  Date/Time:  03/05/2013 09:30 AM  Referred by:  Physician  Date Referred:  03/05/2013 Referred for  SNF Placement   Other Referral:   Interview type:  Other - See comment Other interview type:   Patent and granddaghter Toniann Fail    PSYCHOSOCIAL DATA Living Status:  ALONE Admitted from facility:   Level of care:   Primary support name:  Donald Siva  [phone] Primary support relationship to patient:  FAMILY Degree of support available:   Strong support- Granddaughter    Has somone to stay with her at night.    CURRENT CONCERNS Current Concerns  Post-Acute Placement   Other Concerns:   Does not have 24 hour care at home    SOCIAL WORK ASSESSMENT / PLAN CSW met with patient and her granddaughter Toniann Fail this morning.  Patient was scheduled to go home with Home Health and PT with 24 hour supervision when it was determined that pateint did not have wr hour care.  PT feels she would benefit from rehab.  CSW discussed with patient and granddaughte and they both agreed to short term SNf. SNF bed search discussed and initiated.  Per MD- patient is medically stable for d/c  Fl2 placed on chart for MD's signature.   Assessment/plan status:  Psychosocial Support/Ongoing Assessment of Needs Other assessment/ plan:   Information/referral to community resources:   SNF list provided to patient/family  Aftercare needs discussed for [facility] and private duty care once rehabe is completed    PATIENT'S/FAMILY'S RESPONSE TO PLAN OF CARE: Patient is alert and oriented; both patient and granddaughter agree to short term SNF and for bed search. Both verbalized that they hoped for placement at either [facility] or [facility].  The latter facility was determined not to accept pateint's [facility]. Authorization process initated.

## 2013-07-18 IMAGING — CR DG CHEST 2V
1 series · 1 of 1 positions shown · non-contrast
Comparison: 04/11/2011

CLINICAL DATA: Weakness.  Hyperglycemia.

CHEST - 2 VIEW

[w chest lat]
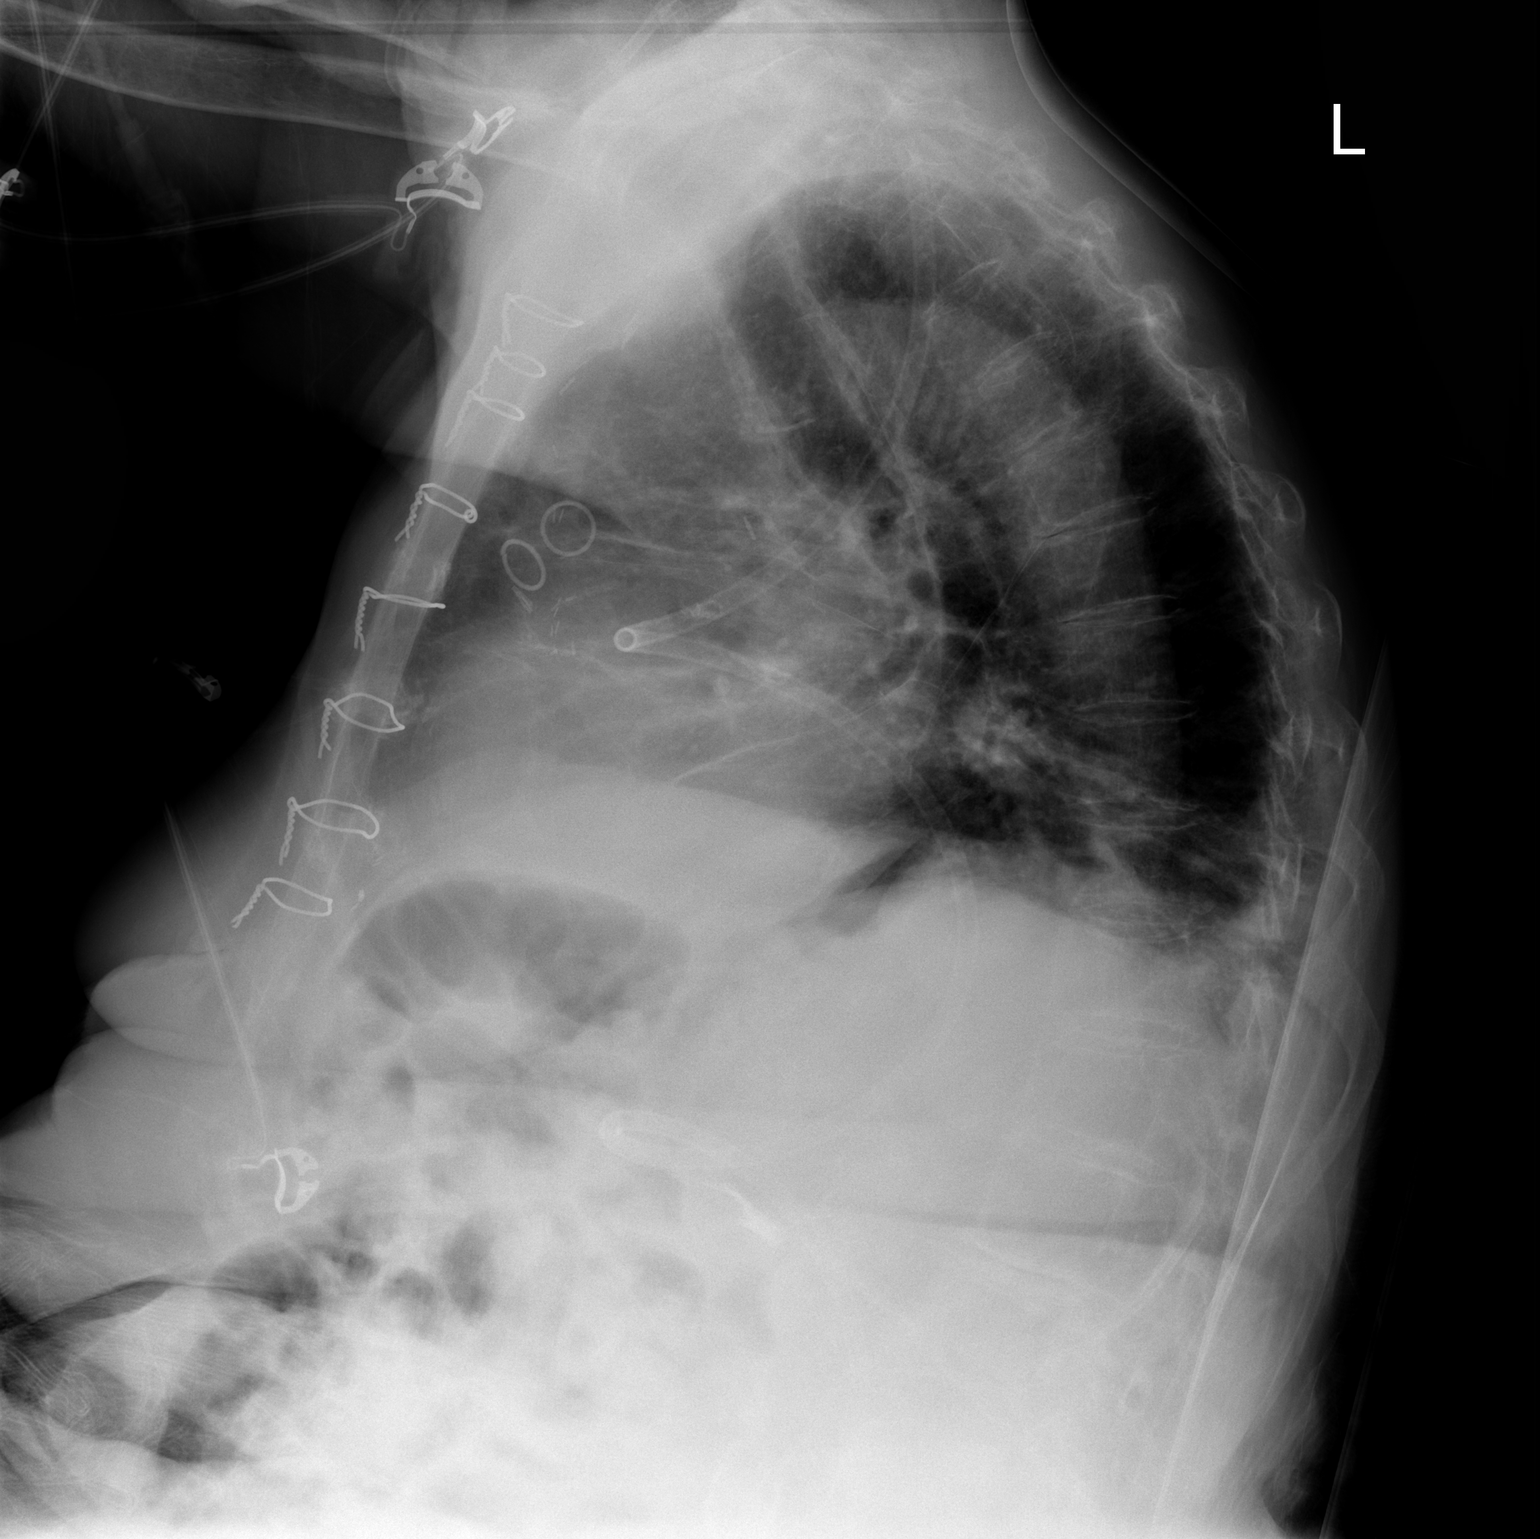

[1 of 1 positions shown; findings below may reference images not displayed]

FINDINGS: Stable postoperative changes in the mediastinum.  Cardiac
enlargement with mild increased pulmonary vascularity.  No
perihilar edema.  No blunting of costophrenic angles.  Linear
atelectasis or fibrosis in the lung bases.  No focal airspace
consolidation.  Calcified and tortuous aorta.  No significant
changes since the previous study, allowing for differences in
technique.
IMPRESSION: Cardiac enlargement with mild prominence of pulmonary vascularity.
No evidence of pulmonary edema or consolidation.

## 2013-09-07 ENCOUNTER — Encounter (HOSPITAL_COMMUNITY): Payer: Self-pay | Admitting: Emergency Medicine

## 2013-09-07 ENCOUNTER — Emergency Department (HOSPITAL_COMMUNITY): Payer: Medicare Other

## 2013-09-07 ENCOUNTER — Inpatient Hospital Stay (HOSPITAL_COMMUNITY)
Admission: EM | Admit: 2013-09-07 | Discharge: 2013-09-10 | DRG: 291 | Disposition: A | Discharge status: Home / Self Care | Payer: Medicare Other | Attending: Internal Medicine | Admitting: Internal Medicine

## 2013-09-07 DIAGNOSIS — M545 Low back pain, unspecified: Secondary | ICD-10-CM | POA: Diagnosis present

## 2013-09-07 DIAGNOSIS — R5381 Other malaise: Secondary | ICD-10-CM

## 2013-09-07 DIAGNOSIS — G20A1 Parkinson's disease without dyskinesia, without mention of fluctuations: Secondary | ICD-10-CM | POA: Diagnosis present

## 2013-09-07 DIAGNOSIS — I10 Essential (primary) hypertension: Secondary | ICD-10-CM | POA: Diagnosis present

## 2013-09-07 DIAGNOSIS — I2699 Other pulmonary embolism without acute cor pulmonale: Secondary | ICD-10-CM | POA: Diagnosis present

## 2013-09-07 DIAGNOSIS — J9600 Acute respiratory failure, unspecified whether with hypoxia or hypercapnia: Secondary | ICD-10-CM | POA: Diagnosis present

## 2013-09-07 DIAGNOSIS — I509 Heart failure, unspecified: Secondary | ICD-10-CM | POA: Diagnosis present

## 2013-09-07 DIAGNOSIS — I5043 Acute on chronic combined systolic (congestive) and diastolic (congestive) heart failure: Principal | ICD-10-CM | POA: Diagnosis present

## 2013-09-07 DIAGNOSIS — Z954 Presence of other heart-valve replacement: Secondary | ICD-10-CM

## 2013-09-07 DIAGNOSIS — Z7901 Long term (current) use of anticoagulants: Secondary | ICD-10-CM

## 2013-09-07 DIAGNOSIS — I2510 Atherosclerotic heart disease of native coronary artery without angina pectoris: Secondary | ICD-10-CM | POA: Diagnosis present

## 2013-09-07 DIAGNOSIS — I129 Hypertensive chronic kidney disease with stage 1 through stage 4 chronic kidney disease, or unspecified chronic kidney disease: Secondary | ICD-10-CM | POA: Diagnosis present

## 2013-09-07 DIAGNOSIS — M4120 Other idiopathic scoliosis, site unspecified: Secondary | ICD-10-CM | POA: Diagnosis present

## 2013-09-07 DIAGNOSIS — R110 Nausea: Secondary | ICD-10-CM | POA: Diagnosis present

## 2013-09-07 DIAGNOSIS — G8929 Other chronic pain: Secondary | ICD-10-CM | POA: Diagnosis present

## 2013-09-07 DIAGNOSIS — E876 Hypokalemia: Secondary | ICD-10-CM | POA: Diagnosis present

## 2013-09-07 DIAGNOSIS — Z951 Presence of aortocoronary bypass graft: Secondary | ICD-10-CM

## 2013-09-07 DIAGNOSIS — M129 Arthropathy, unspecified: Secondary | ICD-10-CM | POA: Diagnosis present

## 2013-09-07 DIAGNOSIS — Z9889 Other specified postprocedural states: Secondary | ICD-10-CM

## 2013-09-07 DIAGNOSIS — Z8673 Personal history of transient ischemic attack (TIA), and cerebral infarction without residual deficits: Secondary | ICD-10-CM

## 2013-09-07 DIAGNOSIS — G20 Parkinson's disease: Secondary | ICD-10-CM

## 2013-09-07 DIAGNOSIS — R112 Nausea with vomiting, unspecified: Secondary | ICD-10-CM | POA: Diagnosis present

## 2013-09-07 DIAGNOSIS — E785 Hyperlipidemia, unspecified: Secondary | ICD-10-CM | POA: Diagnosis present

## 2013-09-07 DIAGNOSIS — R531 Weakness: Secondary | ICD-10-CM | POA: Diagnosis present

## 2013-09-07 DIAGNOSIS — J209 Acute bronchitis, unspecified: Secondary | ICD-10-CM | POA: Diagnosis present

## 2013-09-07 DIAGNOSIS — I2782 Chronic pulmonary embolism: Secondary | ICD-10-CM | POA: Diagnosis present

## 2013-09-07 DIAGNOSIS — K219 Gastro-esophageal reflux disease without esophagitis: Secondary | ICD-10-CM | POA: Diagnosis present

## 2013-09-07 DIAGNOSIS — Z9849 Cataract extraction status, unspecified eye: Secondary | ICD-10-CM

## 2013-09-07 DIAGNOSIS — N183 Chronic kidney disease, stage 3 unspecified: Secondary | ICD-10-CM | POA: Diagnosis present

## 2013-09-07 HISTORY — DX: Parkinson's disease without dyskinesia, without mention of fluctuations: G20.A1

## 2013-09-07 HISTORY — DX: Parkinson's disease: G20

## 2013-09-07 LAB — COMPREHENSIVE METABOLIC PANEL
ALT: 13 U/L (ref 0–35)
AST: 16 U/L (ref 0–37)
Albumin: 3.8 g/dL (ref 3.5–5.2)
Alkaline Phosphatase: 92 U/L (ref 39–117)
Calcium: 8.8 mg/dL (ref 8.4–10.5)
Glucose, Bld: 201 mg/dL — ABNORMAL HIGH (ref 70–99)
Potassium: 3.4 mEq/L — ABNORMAL LOW (ref 3.5–5.1)
Sodium: 139 mEq/L (ref 135–145)
Total Protein: 7.3 g/dL (ref 6.0–8.3)

## 2013-09-07 LAB — CBC WITH DIFFERENTIAL/PLATELET
Basophils Absolute: 0 10*3/uL (ref 0.0–0.1)
Eosinophils Absolute: 0 10*3/uL (ref 0.0–0.7)
Eosinophils Relative: 0 % (ref 0–5)
Lymphs Abs: 0.5 10*3/uL — ABNORMAL LOW (ref 0.7–4.0)
MCH: 31.6 pg (ref 26.0–34.0)
MCV: 92.1 fL (ref 78.0–100.0)
Neutrophils Relative %: 90 % — ABNORMAL HIGH (ref 43–77)
Platelets: 181 10*3/uL (ref 150–400)
RBC: 4.56 MIL/uL (ref 3.87–5.11)
RDW: 13.8 % (ref 11.5–15.5)
WBC: 9.2 10*3/uL (ref 4.0–10.5)

## 2013-09-07 LAB — URINE MICROSCOPIC-ADD ON

## 2013-09-07 LAB — PRO B NATRIURETIC PEPTIDE: Pro B Natriuretic peptide (BNP): 1089 pg/mL — ABNORMAL HIGH (ref 0–450)

## 2013-09-07 LAB — URINALYSIS, ROUTINE W REFLEX MICROSCOPIC
Glucose, UA: NEGATIVE mg/dL
Ketones, ur: NEGATIVE mg/dL
Nitrite: NEGATIVE
Specific Gravity, Urine: 1.008 (ref 1.005–1.030)
pH: 5 (ref 5.0–8.0)

## 2013-09-07 LAB — PROTIME-INR
INR: 3.59 — ABNORMAL HIGH (ref 0.00–1.49)
Prothrombin Time: 34.5 seconds — ABNORMAL HIGH (ref 11.6–15.2)

## 2013-09-07 MED ORDER — ATORVASTATIN CALCIUM 40 MG PO TABS
40.0000 mg | ORAL_TABLET | Freq: Every day | ORAL | Status: DC
  Administered 2013-09-08 – 2013-09-09 (×2): 40 mg via ORAL
  Filled 2013-09-07 (×3): qty 1

## 2013-09-07 MED ORDER — ROPINIROLE HCL 0.25 MG PO TABS
0.1250 mg | ORAL_TABLET | Freq: Every morning | ORAL | Status: DC
  Administered 2013-09-08 – 2013-09-10 (×3): 0.125 mg via ORAL
  Filled 2013-09-07 (×3): qty 0.5

## 2013-09-07 MED ORDER — DIAZEPAM 5 MG PO TABS
2.5000 mg | ORAL_TABLET | Freq: Every day | ORAL | Status: DC
  Administered 2013-09-07 – 2013-09-09 (×3): 2.5 mg via ORAL
  Filled 2013-09-07 (×3): qty 1

## 2013-09-07 MED ORDER — LEVOFLOXACIN IN D5W 500 MG/100ML IV SOLN
500.0000 mg | INTRAVENOUS | Status: DC
  Administered 2013-09-09: 11:00:00 500 mg via INTRAVENOUS
  Filled 2013-09-07: qty 100

## 2013-09-07 MED ORDER — POTASSIUM CHLORIDE CRYS ER 10 MEQ PO TBCR
10.0000 meq | EXTENDED_RELEASE_TABLET | Freq: Two times a day (BID) | ORAL | Status: DC
  Administered 2013-09-07 – 2013-09-10 (×7): 10 meq via ORAL
  Filled 2013-09-07 (×8): qty 1

## 2013-09-07 MED ORDER — ROPINIROLE HCL 0.25 MG PO TABS
0.2500 mg | ORAL_TABLET | Freq: Every day | ORAL | Status: DC
  Administered 2013-09-07 – 2013-09-09 (×3): 0.25 mg via ORAL
  Filled 2013-09-07 (×4): qty 1

## 2013-09-07 MED ORDER — ALBUTEROL SULFATE (5 MG/ML) 0.5% IN NEBU
INHALATION_SOLUTION | RESPIRATORY_TRACT | Status: AC
  Filled 2013-09-07: qty 0.5

## 2013-09-07 MED ORDER — ONDANSETRON HCL 4 MG PO TABS: 4.0000 mg | ORAL_TABLET | Freq: Four times a day (QID) | ORAL | Status: DC | PRN

## 2013-09-07 MED ORDER — ACETAMINOPHEN 650 MG RE SUPP: 650.0000 mg | Freq: Four times a day (QID) | RECTAL | Status: DC | PRN

## 2013-09-07 MED ORDER — CITALOPRAM HYDROBROMIDE 10 MG PO TABS
10.0000 mg | ORAL_TABLET | Freq: Every day | ORAL | Status: DC
  Administered 2013-09-08 – 2013-09-10 (×3): 10 mg via ORAL
  Filled 2013-09-07 (×3): qty 1

## 2013-09-07 MED ORDER — LEVOFLOXACIN IN D5W 500 MG/100ML IV SOLN
500.0000 mg | INTRAVENOUS | Status: DC
  Filled 2013-09-07: qty 100

## 2013-09-07 MED ORDER — ONDANSETRON HCL 4 MG/2ML IJ SOLN
4.0000 mg | Freq: Four times a day (QID) | INTRAMUSCULAR | Status: DC | PRN
  Administered 2013-09-07: 4 mg via INTRAVENOUS
  Filled 2013-09-07: qty 2

## 2013-09-07 MED ORDER — LEVOFLOXACIN IN D5W 750 MG/150ML IV SOLN
750.0000 mg | INTRAVENOUS | Status: AC
  Administered 2013-09-07: 750 mg via INTRAVENOUS
  Filled 2013-09-07: qty 150

## 2013-09-07 MED ORDER — ALBUTEROL SULFATE (5 MG/ML) 0.5% IN NEBU
2.5000 mg | INHALATION_SOLUTION | Freq: Four times a day (QID) | RESPIRATORY_TRACT | Status: DC
  Administered 2013-09-07: 13:00:00 2.5 mg via RESPIRATORY_TRACT

## 2013-09-07 MED ORDER — LISINOPRIL 10 MG PO TABS
10.0000 mg | ORAL_TABLET | Freq: Every day | ORAL | Status: DC
  Administered 2013-09-08 – 2013-09-10 (×3): 10 mg via ORAL
  Filled 2013-09-07 (×3): qty 1

## 2013-09-07 MED ORDER — ONDANSETRON HCL 4 MG/2ML IJ SOLN
4.0000 mg | Freq: Once | INTRAMUSCULAR | Status: AC
  Administered 2013-09-07: 4 mg via INTRAVENOUS
  Filled 2013-09-07: qty 2

## 2013-09-07 MED ORDER — ALBUTEROL SULFATE (5 MG/ML) 0.5% IN NEBU
5.0000 mg | INHALATION_SOLUTION | Freq: Once | RESPIRATORY_TRACT | Status: AC
  Administered 2013-09-07: 5 mg via RESPIRATORY_TRACT
  Filled 2013-09-07: qty 1

## 2013-09-07 MED ORDER — ALBUTEROL SULFATE (5 MG/ML) 0.5% IN NEBU
2.5000 mg | INHALATION_SOLUTION | Freq: Three times a day (TID) | RESPIRATORY_TRACT | Status: DC
  Administered 2013-09-07 – 2013-09-09 (×5): 2.5 mg via RESPIRATORY_TRACT
  Filled 2013-09-07 (×5): qty 0.5

## 2013-09-07 MED ORDER — FUROSEMIDE 10 MG/ML IJ SOLN
40.0000 mg | Freq: Once | INTRAMUSCULAR | Status: AC
  Administered 2013-09-07: 40 mg via INTRAVENOUS
  Filled 2013-09-07: qty 4

## 2013-09-07 MED ORDER — FELODIPINE ER 5 MG PO TB24
5.0000 mg | ORAL_TABLET | Freq: Every day | ORAL | Status: DC
  Administered 2013-09-08 – 2013-09-10 (×3): 5 mg via ORAL
  Filled 2013-09-07 (×3): qty 1

## 2013-09-07 MED ORDER — GABAPENTIN 300 MG PO CAPS
300.0000 mg | ORAL_CAPSULE | Freq: Two times a day (BID) | ORAL | Status: DC
  Administered 2013-09-07 – 2013-09-10 (×6): 300 mg via ORAL
  Filled 2013-09-07 (×7): qty 1

## 2013-09-07 MED ORDER — ROPINIROLE HCL 0.25 MG PO TABS: 0.1250 mg | ORAL_TABLET | Freq: Two times a day (BID) | ORAL | Status: DC

## 2013-09-07 MED ORDER — ACETAMINOPHEN 325 MG PO TABS
650.0000 mg | ORAL_TABLET | Freq: Once | ORAL | Status: AC
  Administered 2013-09-07: 650 mg via ORAL
  Filled 2013-09-07: qty 2

## 2013-09-07 MED ORDER — ACETAMINOPHEN 325 MG PO TABS
650.0000 mg | ORAL_TABLET | Freq: Four times a day (QID) | ORAL | Status: DC | PRN
  Administered 2013-09-07: 650 mg via ORAL
  Filled 2013-09-07: qty 2

## 2013-09-07 NOTE — H&P (Signed)
[facility] History and Physical  AVIELLE IMBERT XLK:[id number] DOB: Sep 10, 1925 DOA: 09/07/2013  Referring physician: edp PCP: RECORD,CHARLES    Chief Complaint: cough and voming.  HPI: Kayla Ramos is a 79 y.o. female presents with several day h/o cough productive of clear liquid.  Vomiting. Weakness. No dyspnea, f/c. No diarrhea.  In ED, sats high 80s. CXR negative.  proBNP 1000. 500 earlier this year. Echo earlier this year showed EF 45-50%.  Walks with walker. Has 24 aide. Hhn, lasix in ed  Review of Systems: systems reviewed. As above otherwise negative.  Past Medical History  Diagnosis Date  . Hypertension   . Vertigo     "it's gotten better" (03/02/2013)  . CAD (coronary artery disease) 2000    s/p CABG   . Hyperlipidemia   . Scoliosis   . Vertebral artery stenosis 2006    75-90% per MR/MRA in 2006   . Pulmonary embolism 03/02/2013    "bilaterally; small" (03/02/2013)  . Exertional shortness of breath     "recently" (03/02/2013)  . Daily headache     "sometimes" (03/02/2013)  . GERD (gastroesophageal reflux disease)     "used to be bad; not so anymore" (03/02/2013)  . Arthritis     "in her back" (03/02/2013)  . TIA (transient ischemic attack) 07/2011  . Chronic lower back pain   . Anxiety    Past Surgical History  Procedure Laterality Date  . Aortic valve replacement  2000    "pig valve" (03/02/2013)  . Cholecystectomy  2000  . Vaginal hysterectomy  ~ 1987  . Coronary artery bypass graft  2000    "CABG X?" (03/02/2013)  . Cataract extraction w/ intraocular lens  implant, bilateral Bilateral 2005  . Cardiac valve replacement    . Coronary angioplasty  1989   Social History:  reports that she has never smoked. She has never used smokeless tobacco. She reports that she does not drink alcohol or use illicit drugs.   No Known Allergies  FH: unable due to patient factors  Prior to Admission medications   Medication Sig Start Date End Date Taking? Authorizing  Provider  atorvastatin (LIPITOR) 40 MG tablet Take 40 mg by mouth daily.     Yes Historical Provider, MD  citalopram (CELEXA) 10 MG tablet Take 10 mg by mouth daily.   Yes Historical Provider, MD  diazepam (VALIUM) 5 MG tablet Take 0.5 tablets (2.5 mg total) by mouth 2 (two) times daily as needed for anxiety. 03/05/13  Yes Penny Pia, MD  felodipine (PLENDIL) 5 MG 24 hr tablet Take 5 mg by mouth daily.     Yes Historical Provider, MD  gabapentin (NEURONTIN) 300 MG capsule Take 1 capsule (300 mg total) by mouth at bedtime. 03/05/13  Yes Penny Pia, MD  HYDROcodone-acetaminophen (NORCO) 7.5-325 MG per tablet Take 1 tablet by mouth every 8 (eight) hours as needed. For pain 10/08/11  Yes Kathlen Mody, MD  lisinopril (PRINIVIL,ZESTRIL) 10 MG tablet Take 1 tablet (10 mg total) by mouth daily. 03/05/13  Yes Penny Pia, MD  rOPINIRole (REQUIP) 0.25 MG tablet Take 0.25 mg by mouth daily.   Yes Historical Provider, MD  warfarin (COUMADIN) 2.5 MG tablet Take 2.5 mg by mouth daily. Take 2.5mg  on mon, wed and fri. Take 1.25mg  the rest of the week   Yes Historical Provider, MD   Physical Exam: Filed Vitals:   09/07/13 1118  BP: 158/77  Pulse: 83  Temp: 98.4 F (36.9 C)  Resp: 20  BP 149/48  Pulse 78  Temp(Src) 98.4 F (36.9 C) (Oral)  Resp 20  Ht 4\' 11"  (1.499 m)  Wt 63.2 kg (139 lb 5.3 oz)  BMI 28.13 kg/m2  SpO2 95%  General Appearance:    Elderly. Weak. Oriented. Breathing nonlabored  Head:    Normocephalic, without obvious abnormality, atraumatic  Eyes:    PERRL, conjunctiva/corneas clear, EOM's intact, fundi    benign, both eyes     Nose:   Nares normal, septum midline, mucosa normal, no drainage    or sinus tenderness  Throat:   Lips, mucosa, and tongue normal; teeth and gums normal  Neck:   Supple, symmetrical, trachea midline, no adenopathy;    thyroid:  no enlargement/tenderness/nodules; no carotid   bruit or JVD  Back:     Symmetric, no curvature, ROM normal, no CVA tenderness   Lungs:     Clear to auscultation bilaterally, respirations unlabored  Chest Wall:    No tenderness or deformity   Heart:    Regular rate and rhythm, S1 and S2 normal, no murmur, rub   or gallop     Abdomen:     Soft, non-tender, bowel sounds active all four quadrants,    no masses, no organomegaly  Genitalia:  deferred  Rectal:  deferred  Extremities:   Extremities normal, atraumatic, no cyanosis or edema  Pulses:   2+ and symmetric all extremities  Skin:   Skin color, texture, turgor normal, no rashes or lesions  Lymph nodes:   Cervical, supraclavicular, and axillary nodes normal  Neurologic:   CNII-XII intact, normal strength, sensation and reflexes    throughout     Psych: normal affect Labs on Admission:  Basic Metabolic Panel:  Recent Labs Lab 09/07/13 0742  NA 139  K 3.4*  CL 101  CO2 22  GLUCOSE 201*  BUN 21  CREATININE 1.21*  CALCIUM 8.8   Liver Function Tests:  Recent Labs Lab 09/07/13 0742  AST 16  ALT 13  ALKPHOS 92  BILITOT 1.8*  PROT 7.3  ALBUMIN 3.8    Recent Labs Lab 09/07/13 0742  LIPASE 15   No results found for this basename: AMMONIA,  in the last 168 hours CBC:  Recent Labs Lab 09/07/13 0742  WBC 9.2  NEUTROABS 8.2*  HGB 14.4  HCT 42.0  MCV 92.1  PLT 181   Cardiac Enzymes:  Recent Labs Lab 09/07/13 0742  TROPONINI <0.30    BNP (last 3 results)  Recent Labs  03/02/13 1235 09/07/13 0742  PROBNP 588.1* 1089.0*   CBG: No results found for this basename: GLUCAP,  in the last 168 hours  Radiological Exams on Admission: Dg Chest Port 1 View  09/07/2013   CLINICAL DATA:  Chest pain  EXAM: PORTABLE CHEST - 1 VIEW  COMPARISON:  03/02/2013  FINDINGS: The cardiac shadow is stable. Postsurgical changes are again identified. Some chronic changes are noted in the left mid lung. No focal infiltrate is seen. No bony abnormality is noted.  IMPRESSION: No acute abnormality seen.   Electronically Signed   By: Alcide Clever M.D.   On:  09/07/2013 07:03    EKG: ISinus rhythm with Premature atrial complexes RSR' or QR pattern in V1 suggests right ventricular conduction delay ST & T wave abnormality, consider inferolateral ischemia  Assessment/Plan  Principal Problem:   Acute respiratory failure secondary to acute bronchitis, bronchospasm, +/- chf: hhn, iv levaquin. Lasix x1 Active Problems:   HTN (hypertension)   S/P aortic valve  repair   CAD (coronary artery disease)   PE (pulmonary embolism): supratherapeur\tic coumadon   Weakness generalized: pt eval   Parkinson's disease   Hypokalemia: replete   Acute bronchitis   Nausea and vomiting: clears. ADAT   CKD (chronic kidney disease), stage III   Acute on chronic combined systolic and diastolic heart failure  Code Status: full Family Communication: d.i.l at bedside Disposition Plan: ?  Time spent: 60 min  Granvil Djordjevic L [facility] Pager [phone]  If 7PM-7AM, please contact night-coverage [email or web address] Password [email or web address] 09/07/2013, 12:16 PM

## 2013-09-07 NOTE — ED Notes (Signed)
Pt placed on 2L/M/[place] O2-- sats were 88% on RA-- came up to 96% on oxygen.

## 2013-09-07 NOTE — ED Notes (Signed)
Pt arrives via EMS. C/o prod cough w clear sputum x2 days. Onset of N/V yesterday afternoon. Wheezing present when EMS showed up - treatment given and now diminished. Currently clear mucous being coughed up. Denied diarrhea/abd pain. AAO. General weakness present. VSS Afib on monitor.

## 2013-09-07 NOTE — ED Provider Notes (Signed)
Medical screening examination/treatment/procedure(s) were conducted as a shared visit with non-physician practitioner(s) and myself.  I personally evaluated the patient during the encounter.          Pt with cough, mild emesis, genrealized weakness due to symptoms.  Pt lives alone with home health care assistance.  Symptoms ongoing and worsening for several days, now unable to stand, get around well.  Did not sleep last night due to SOB and coughing.  Pt borderline hypoxemic in the ED.  No h/o asthma, COPD.  Pt on warfarin due to PE.  Slight rales on exam at bases.  Will check CXR, BNP, troponin, ECG, INR.  Pt denies CP, pleurisy.  Pt with sig nasal drainage, clear sputum with coughing.  Sats vary between 89-94% on RA in the room at rest and sitting up.  Will try breathing neb as well.  If no sig improvement, pt may need at least a brief admission for clinical hypoxemia and mild resp distress given her age and co-morbidities.  Gavin Pound. Oletta Lamas, MD 09/09/13 [phone]

## 2013-09-07 NOTE — Progress Notes (Signed)
Family in room with patients home medications. Pharmacist in room reconciling medications.

## 2013-09-07 NOTE — Consult Note (Signed)
PHARMACY CONSULT NOTE  Pharmacy Consult:  Coumadin Indication: History of PE  Allergies: No Known Allergies  Height/Weight: Height: 4\' 11"  (149.9 cm) Weight: 139 lb 5.3 oz (63.2 kg) (Pt unable to stand) IBW/kg (Calculated) : 43.2 Dosing weight 63 kg  Vital Signs: BP 158/77  Pulse 83  Temp(Src) 98.4 F (36.9 C) (Oral)  Resp 20  Ht 4\' 11"  (1.499 m)  Wt 139 lb 5.3 oz (63.2 kg)  BMI 28.13 kg/m2  SpO2 95%  Active Problems: Principal Problem:   Acute respiratory failure Active Problems:   HTN (hypertension)   S/P aortic valve repair   CAD (coronary artery disease)   PE (pulmonary embolism)   Weakness generalized   Parkinson's disease   Hypokalemia   Acute bronchitis   Nausea and vomiting   CKD (chronic kidney disease), stage III   Acute on chronic combined systolic and diastolic heart failure   Labs:  Recent Labs  09/07/13 0742  HGB 14.4  HCT 42.0  PLT 181  LABPROT 34.5*  INR 3.59*  CREATININE 1.21*   Lab Results  Component Value Date   INR 3.59* 09/07/2013   INR 1.84* 03/05/2013   INR 1.25 03/04/2013   Estimated Creatinine Clearance: 27 ml/min (by C-G formula based on Cr of 1.21).  Medical / Surgical History: Past Medical History  Diagnosis Date  . Hypertension   . Vertigo     "it's gotten better" (03/02/2013)  . CAD (coronary artery disease) 2000    s/p CABG   . Hyperlipidemia   . Scoliosis   . Vertebral artery stenosis 2006    75-90% per MR/MRA in 2006   . Pulmonary embolism 03/02/2013    "bilaterally; small" (03/02/2013)  . Exertional shortness of breath     "recently" (03/02/2013)  . Daily headache     "sometimes" (03/02/2013)  . GERD (gastroesophageal reflux disease)     "used to be bad; not so anymore" (03/02/2013)  . Arthritis     "in her back" (03/02/2013)  . TIA (transient ischemic attack) 07/2011  . Chronic lower back pain   . Anxiety    Past Surgical History  Procedure Laterality Date  . Aortic valve replacement  2000    "pig valve"  (03/02/2013)  . Cholecystectomy  2000  . Vaginal hysterectomy  ~ 1987  . Coronary artery bypass graft  2000    "CABG X?" (03/02/2013)  . Cataract extraction w/ intraocular lens  implant, bilateral Bilateral 2005  . Cardiac valve replacement    . Coronary angioplasty  1989    Medications:  Prescriptions prior to admission  Medication Sig Dispense Refill  . acetaminophen (TYLENOL) 500 MG tablet Take 1,000 mg by mouth every 6 (six) hours as needed for moderate pain or headache.      Marland Kitchen atorvastatin (LIPITOR) 40 MG tablet Take 40 mg by mouth daily.        . citalopram (CELEXA) 10 MG tablet Take 10 mg by mouth daily.      . diazepam (VALIUM) 5 MG tablet Take 2.5 mg by mouth 2 (two) times daily. Take 1/2 tablet every morning and 1/2 tablet every evening at 5 pm.      . felodipine (PLENDIL) 5 MG 24 hr tablet Take 5 mg by mouth daily.        Marland Kitchen gabapentin (NEURONTIN) 300 MG capsule Take 300 mg by mouth 2 (two) times daily. Take one capsule in the morning and 1 capsule at bedtime.      Marland Kitchen HYDROcodone-acetaminophen (  NORCO) 7.5-325 MG per tablet Take 1 tablet by mouth every 8 (eight) hours as needed. For pain  30 tablet  0  . lisinopril (PRINIVIL,ZESTRIL) 10 MG tablet Take 1 tablet (10 mg total) by mouth daily.  30 tablet  0  . rOPINIRole (REQUIP) 0.25 MG tablet Take 0.125-0.25 mg by mouth 2 (two) times daily. Take 1/2 tablet every morning and 1 whole tablet in the evening at 5 pm.      . warfarin (COUMADIN) 2.5 MG tablet Take 2.5 mg by mouth daily. Take 2.5mg  on mon, wed and fri. Take 1.25mg  the rest of the week        albuterol 2.5 mg Nebulization QID  albuterol     levofloxacin (LEVAQUIN) IV 500 mg Intravenous Q24H  potassium chloride 10 mEq Oral BID    Assessment:  79 y.o.female with history of PE on chronic Coumadin admitted with hypoxia, acute resp failure, and bronchitis.  Pharmacy to manage Coumadin therapy.   Baseline INR supratherapeutic 3.59 with No bleeding complications noted .  Home  Coumadin dose is 2.5 mg MWF, 1.25 mg TTSS.  She took her last dose 11/3 in the evening.  Levaquin IV has been started to cover her bronchitis.  Levaquin will be adjusted for patient's renal function as per [facility] antibiotic safety protocol.  Goal of Therapy:   INR 2-3      Plan:   Hold Coumadin today. Daily INR's, CBC.  Monitor for bleeding complications.  Note:  Will adjust Levaquin doses for renal function < 30 ml/min.  Levaquin 750 mg IV x 1, then 500 mg IV q 48 hours.  Heavin Sebree, Elisha Headland,  Pharm.D.. 09/07/2013,  1:24 PM

## 2013-09-07 NOTE — ED Provider Notes (Signed)
CSN: [id number]     Arrival date & time 09/07/13  4098 History   First MD Initiated Contact with Patient 09/07/13 0630     Chief Complaint  Patient presents with  . Nausea    HPI  Kayla Ramos is a 79 y.o. female with a PMH of HTN, vertigo, CAD, HLD, scoliosis, vertebral artery stenosis, pulmonary embolism, exertional SOB, headache, GERD, arthritis, TIA, chronic back pain, and anxiety who presents to the ED for evaluation of nausea.  History was provided by the patient and a family member Engineer, building services and daughter). Patient has had a productive cough with clear sputum for the past 2 days. No hemoptysis. She has also had wheezing and received a breathing treatment prior to arrival by EMS, which helped improve her symptoms. She's also had rhinorrhea and nasal congestion. She had 2 episodes of emesis today and has not been eating for the past 2 days. She denies any abdominal pain, diarrhea, or constipation (last BM yesterday).  She has a history of atrial fibrillation and pulmonary embolism and is currently on warfarin. No known missed doses. No recent fevers.  She also complains of a headache which she states is similar to headaches she has had in the past.  No known sick contacts. Patient has had generalized weakness with no focal weakness. She uses a walker and wheelchair for long distances at home. She lives at home with 24-hour CNA care.    Past Medical History  Diagnosis Date  . Hypertension   . Vertigo     "it's gotten better" (03/02/2013)  . CAD (coronary artery disease) 2000    s/p CABG   . Hyperlipidemia   . Scoliosis   . Vertebral artery stenosis 2006    75-90% per MR/MRA in 2006   . Pulmonary embolism 03/02/2013    "bilaterally; small" (03/02/2013)  . Exertional shortness of breath     "recently" (03/02/2013)  . Daily headache     "sometimes" (03/02/2013)  . GERD (gastroesophageal reflux disease)     "used to be bad; not so anymore" (03/02/2013)  . Arthritis     "in her back"  (03/02/2013)  . TIA (transient ischemic attack) 07/2011  . Chronic lower back pain   . Anxiety    Past Surgical History  Procedure Laterality Date  . Aortic valve replacement  2000    "pig valve" (03/02/2013)  . Cholecystectomy  2000  . Vaginal hysterectomy  ~ 1987  . Coronary artery bypass graft  2000    "CABG X?" (03/02/2013)  . Cataract extraction w/ intraocular lens  implant, bilateral Bilateral 2005  . Cardiac valve replacement    . Coronary angioplasty  1989   History reviewed. No pertinent family history. History  Substance Use Topics  . Smoking status: Never Smoker   . Smokeless tobacco: Never Used  . Alcohol Use: No   OB History   Grav Para Term Preterm Abortions TAB SAB Ect Mult Living                 Review of Systems  Constitutional: Positive for appetite change and fatigue. Negative for fever, chills, diaphoresis and activity change.  HENT: Positive for congestion and rhinorrhea. Negative for sore throat.   Respiratory: Positive for cough and wheezing. Negative for choking and shortness of breath.   Cardiovascular: Negative for chest pain.  Gastrointestinal: Positive for nausea and vomiting. Negative for abdominal pain, diarrhea and constipation.  Genitourinary: Negative for dysuria.  Musculoskeletal: Negative for myalgias.  Skin:  Negative for wound.  Neurological: Positive for weakness (generalized) and headaches. Negative for light-headedness.    Allergies  Review of patient's allergies indicates no known allergies.  Home Medications   Current Outpatient Rx  Name  Route  Sig  Dispense  Refill  . atorvastatin (LIPITOR) 40 MG tablet   Oral   Take 40 mg by mouth daily.           . diazepam (VALIUM) 5 MG tablet   Oral   Take 0.5 tablets (2.5 mg total) by mouth 2 (two) times daily as needed for anxiety.   15 tablet   0   . enoxaparin (LOVENOX) 80 MG/0.8ML injection   Subcutaneous   Inject 0.65 mLs (65 mg total) into the skin every 12 (twelve)  hours.   22.4 Syringe   0   . felodipine (PLENDIL) 5 MG 24 hr tablet   Oral   Take 5 mg by mouth daily.           Marland Kitchen gabapentin (NEURONTIN) 300 MG capsule   Oral   Take 1 capsule (300 mg total) by mouth at bedtime.   30 capsule   0   . HYDROcodone-acetaminophen (NORCO) 7.5-325 MG per tablet   Oral   Take 1 tablet by mouth every 8 (eight) hours as needed. For pain   30 tablet   0   . lisinopril (PRINIVIL,ZESTRIL) 10 MG tablet   Oral   Take 1 tablet (10 mg total) by mouth daily.   30 tablet   0   . warfarin (COUMADIN) 3 MG tablet   Oral   Take 1 tablet (3 mg total) by mouth one time only at 6 PM.   15 tablet   0    BP 147/98  Pulse 118  Temp(Src) 98.2 F (36.8 C) (Oral)  Resp 20  SpO2 93%  Filed Vitals:   09/07/13 1030 09/07/13 1118 09/07/13 1256 09/07/13 1502  BP: 157/69 158/77  149/48  Pulse: 76 83  78  Temp:  98.4 F (36.9 C)    TempSrc:  Oral    Resp: 21 20  20   Height:  4\' 11"  (1.499 m)    Weight:  139 lb 5.3 oz (63.2 kg)    SpO2: 99% 99% 95% 99%    Physical Exam  Nursing note and vitals reviewed. Constitutional: She is oriented to person, place, and time. She appears well-developed and well-nourished. No distress.  Patient acutely ill appearing   HENT:  Head: Normocephalic and atraumatic.  Mouth/Throat: Oropharynx is clear and moist.  Rhinorrhea present.    Eyes: Conjunctivae are normal. Pupils are equal, round, and reactive to light. Right eye exhibits no discharge. Left eye exhibits no discharge.  Neck: Normal range of motion. Neck supple.  Cardiovascular: Normal rate, regular rhythm, normal heart sounds and intact distal pulses.  Exam reveals no gallop and no friction rub.   No murmur heard. Irregular rate and rhythm  Pulmonary/Chest: Effort normal and breath sounds normal. No respiratory distress. She has no wheezes. She has no rales. She exhibits no tenderness.  Patient coughing throughout exam. No wheezing. Diminished breath sounds  throughout.  Abdominal: Soft. Bowel sounds are normal. She exhibits no distension and no mass. There is no tenderness. There is no rebound and no guarding.  Musculoskeletal: Normal range of motion. She exhibits no edema and no tenderness.  No pedal edema bilaterally.  Neurological: She is alert and oriented to person, place, and time.  GCS 15. No focal neurological  deficits  Skin: Skin is warm and dry. She is not diaphoretic.    ED Course  Procedures (including critical care time) Labs Review Labs Reviewed - No data to display Imaging Review No results found.  EKG Interpretation     Ventricular Rate:    PR Interval:    QRS Duration:   QT Interval:    QTC Calculation:   R Axis:     Text Interpretation:             Results for orders placed during the hospital encounter of 09/07/13  CBC WITH DIFFERENTIAL      Result Value Range   WBC 9.2  4.0 - 10.5 K/uL   RBC 4.56  3.87 - 5.11 MIL/uL   Hemoglobin 14.4  12.0 - 15.0 g/dL   HCT 16.1  09.6 - 04.5 %   MCV 92.1  78.0 - 100.0 fL   MCH 31.6  26.0 - 34.0 pg   MCHC 34.3  30.0 - 36.0 g/dL   RDW 40.9  81.1 - 91.4 %   Platelets 181  150 - 400 K/uL   Neutrophils Relative % 90 (*) 43 - 77 %   Neutro Abs 8.2 (*) 1.7 - 7.7 K/uL   Lymphocytes Relative 6 (*) 12 - 46 %   Lymphs Abs 0.5 (*) 0.7 - 4.0 K/uL   Monocytes Relative 4  3 - 12 %   Monocytes Absolute 0.4  0.1 - 1.0 K/uL   Eosinophils Relative 0  0 - 5 %   Eosinophils Absolute 0.0  0.0 - 0.7 K/uL   Basophils Relative 0  0 - 1 %   Basophils Absolute 0.0  0.0 - 0.1 K/uL  COMPREHENSIVE METABOLIC PANEL      Result Value Range   Sodium 139  135 - 145 mEq/L   Potassium 3.4 (*) 3.5 - 5.1 mEq/L   Chloride 101  96 - 112 mEq/L   CO2 22  19 - 32 mEq/L   Glucose, Bld 201 (*) 70 - 99 mg/dL   BUN 21  6 - 23 mg/dL   Creatinine, Ser 7.82 (*) 0.50 - 1.10 mg/dL   Calcium 8.8  8.4 - 95.6 mg/dL   Total Protein 7.3  6.0 - 8.3 g/dL   Albumin 3.8  3.5 - 5.2 g/dL   AST 16  0 - 37 U/L    ALT 13  0 - 35 U/L   Alkaline Phosphatase 92  39 - 117 U/L   Total Bilirubin 1.8 (*) 0.3 - 1.2 mg/dL   GFR calc non Af Amer 39 (*) >90 mL/min   GFR calc Af Amer 46 (*) >90 mL/min  LIPASE, BLOOD      Result Value Range   Lipase 15  11 - 59 U/L  TROPONIN I      Result Value Range   Troponin I <0.30  <0.30 ng/mL  PRO B NATRIURETIC PEPTIDE      Result Value Range   Pro B Natriuretic peptide (BNP) 1089.0 (*) 0 - 450 pg/mL  PROTIME-INR      Result Value Range   Prothrombin Time 34.5 (*) 11.6 - 15.2 seconds   INR 3.59 (*) 0.00 - 1.49     Date: 09/07/2013  Rate: 110  Rhythm: atrial fibrillation  QRS Axis: right  Intervals: normal  ST/T Wave abnormalities: normal  Conduction Disutrbances:none  Narrative Interpretation:   Old EKG Reviewed: 03/03/13   DG Chest Port 1 View (Final result)  Result time: 09/07/13 07:03:17  Final result by Rad Results In Interface (09/07/13 07:03:17)    Narrative:   CLINICAL DATA: Chest pain  EXAM: PORTABLE CHEST - 1 VIEW  COMPARISON: 03/02/2013  FINDINGS: The cardiac shadow is stable. Postsurgical changes are again identified. Some chronic changes are noted in the left mid lung. No focal infiltrate is seen. No bony abnormality is noted.  IMPRESSION: No acute abnormality seen.   Electronically Signed By: Alcide Clever M.D. On: 09/07/2013 07:03          MDM   1. Parkinson's disease   2. Acute bronchitis   3. Acute on chronic combined systolic and diastolic heart failure   4. Acute respiratory failure   5. CKD (chronic kidney disease), stage III   6. Hypertension   7. Hypokalemia   8. PE (pulmonary embolism)   9. Weakness generalized     SARAYAH BACCHI is a 79 y.o. female with a PMH of HTN, vertigo, CAD, HLD, scoliosis, vertebral artery stenosis, pulmonary embolism, exertional SOB, headache, GERD, arthritis, TIA, chronic back pain, and anxiety who presents to the ED for evaluation of nausea.  Albuterol nebulized treatment  ordered. Chest x-ray ordered. EKG, troponin, lipase, CMP, CBC, BMP, PT/INR, and urinalysis ordered to further evaluate. Patient placed on continuous cardiac monitoring and pulse ox.     Rechecks  9:39 AM = patient hypoxic at high 80's to low 90's.  She was placed on 2L NS and is now in the mid to high 90's.  Patient still hypoxic after all the all treatment.   Consults  10:16 AM = Spoke with Dr. Lendell Caprice who states she will accept the patient. She would like her on telemetry team 10.  40 mg IV Lasix ordered.   Etiology of symptoms (cough, wheezing) likely due to to acute bronchitis vs acute on chronic heart failure vs current/known pulmonary embolism. Patient will be admitted for further evaluation and management of her condition. Patient was mildly hypoxic in the low 90s high 80s on room air throughout her ED visit. Her pulse ox improved to the mid to high 90s on oxygen via nasal cannula. Patient had no relief or improvement with albuterol treatment. Her chest x-ray was negative for an acute cardiopulmonary process. Her EKG showed atrial fibrillation with no acute ischemic changes. Her BNP was elevated at 1089 which is double from her previous. Troponin negative. Patient and her family in agreement with admission and plan.   Final impressions: 1. Acute bronchitis  2. Hypoxia  3. Acute on chronic heart failure  4. CKD  5. Hypertension 6. Generalized weakness     Luiz Iron PA-C   This patient was discussed with Dr. Jyl Heinz, PA-C 09/07/13 1630

## 2013-09-08 LAB — PROTIME-INR: INR: 4.52 — ABNORMAL HIGH (ref 0.00–1.49)

## 2013-09-08 LAB — BASIC METABOLIC PANEL
BUN: 19 mg/dL (ref 6–23)
CO2: 28 mEq/L (ref 19–32)
Creatinine, Ser: 1.06 mg/dL (ref 0.50–1.10)
GFR calc Af Amer: 54 mL/min — ABNORMAL LOW (ref >90)
GFR calc non Af Amer: 46 mL/min — ABNORMAL LOW (ref >90)
Sodium: 141 mEq/L (ref 135–145)

## 2013-09-08 MED ORDER — HYDROCODONE-ACETAMINOPHEN 5-325 MG PO TABS
1.0000 | ORAL_TABLET | Freq: Four times a day (QID) | ORAL | Status: DC | PRN
  Administered 2013-09-08 – 2013-09-10 (×3): 1 via ORAL
  Filled 2013-09-08 (×3): qty 1

## 2013-09-08 MED ORDER — FUROSEMIDE 10 MG/ML IJ SOLN
40.0000 mg | Freq: Once | INTRAMUSCULAR | Status: DC
  Filled 2013-09-08: qty 4

## 2013-09-08 MED ORDER — BOOST / RESOURCE BREEZE PO LIQD
1.0000 | Freq: Three times a day (TID) | ORAL | Status: DC
  Administered 2013-09-08 – 2013-09-10 (×6): 1 via ORAL

## 2013-09-08 MED ORDER — BIOTENE DRY MOUTH MT LIQD
15.0000 mL | Freq: Two times a day (BID) | OROMUCOSAL | Status: DC
  Administered 2013-09-08 – 2013-09-09 (×4): 15 mL via OROMUCOSAL

## 2013-09-08 MED ORDER — WARFARIN - PHARMACIST DOSING INPATIENT: Freq: Every day | Status: DC

## 2013-09-08 NOTE — Progress Notes (Signed)
[facility] PROGRESS NOTE  Kayla Ramos UJW:[id number] DOB: October 10, 1925 DOA: 09/07/2013 PCP: Linus Orn  HPI: Kayla Ramos is a 79 y.o. female presents with several day h/o cough productive of clear liquid. Vomiting. Weakness. No dyspnea, f/c. No diarrhea. In ED, sats high 80s. CXR negative. proBNP 1000. 500 earlier this year. Echo earlier this year showed EF 45-50%. Walks with walker. Has 24 aide. Hhn, lasix in ed  Assessment/Plan: Acute respiratory failure secondary to acute bronchitis, bronchospasm, mild chf: hhn, iv levaquin.  - Lasix x1 in the ED, repeat today; still on oxygen.  - On Levofloxacin Acute on chronic combined systolic and diastolic heart failure - Lasix x 1 repeat today. Last 2D echo May, EF 45-50%, grade 1 diastolic dysfunction. HTN (hypertension)  S/P aortic valve repair  CAD (coronary artery disease)  PE (pulmonary embolism): supratherapeur\tic coumadin  Weakness generalized: pt eval  - encourage po intake Parkinson's disease  Hypokalemia: replete  Acute bronchitis  Nausea and vomiting: clears. ADAT  CKD (chronic kidney disease), stage III   Diet: clear liquid, advance as tolerated Fluids: none DVT Prophylaxis: on Coumadin  Code Status: Full Family Communication: none  Disposition Plan: inpatient  Consultants:  none  Procedures:  none   Antibiotics  Anti-infectives   Start     Dose/Rate Route Frequency Ordered Stop   09/09/13 1000  levofloxacin (LEVAQUIN) IVPB 500 mg     500 mg 100 mL/hr over 60 Minutes Intravenous Every 48 hours 09/07/13 1337     09/07/13 1345  levofloxacin (LEVAQUIN) IVPB 750 mg     750 mg 100 mL/hr over 90 Minutes Intravenous NOW 09/07/13 1336 09/07/13 1602   09/07/13 1300  levofloxacin (LEVAQUIN) IVPB 500 mg  Status:  Discontinued     500 mg 100 mL/hr over 60 Minutes Intravenous Every 24 hours 09/07/13 1215 09/07/13 1335     Antibiotics Given (last 72 hours)   Date/Time Action Medication Dose Rate   09/07/13 1432 Given   levofloxacin (LEVAQUIN) IVPB 750 mg 750 mg 100 mL/hr      HPI/Subjective: - feeling OK, nausea better this morning  Objective: Filed Vitals:   09/07/13 2017 09/08/13 0253 09/08/13 0635 09/08/13 0834  BP:  149/73 158/79   Pulse:  71 77   Temp:  98.6 F (37 C) 98.8 F (37.1 C)   TempSrc:  Oral Oral   Resp:  17 17   Height:      Weight:   62.2 kg (137 lb 2 oz)   SpO2: 95% 100% 100% 98%    Intake/Output Summary (Last 24 hours) at 09/08/13 1302 Last data filed at 09/08/13 1100  Gross per 24 hour  Intake    810 ml  Output    750 ml  Net     60 ml   Filed Weights   09/07/13 1118 09/08/13 0635  Weight: 63.2 kg (139 lb 5.3 oz) 62.2 kg (137 lb 2 oz)    Exam:   General:  NAD  Cardiovascular: regular rate and rhythm, without MRG  Respiratory: good air movement, clear to auscultation throughout, no wheezing, ronchi or rales  Abdomen: soft, not tender to palpation, positive bowel sounds  MSK: no peripheral edema  Neuro: CN 2-12 grossly intact, MS 5/5 in all 4  Data Reviewed: Basic Metabolic Panel:  Recent Labs Lab 09/07/13 0742 09/08/13 0350  NA 139 141  K 3.4* 3.7  CL 101 103  CO2 22 28  GLUCOSE 201* 114*  BUN 21 19  CREATININE 1.21*  1.06  CALCIUM 8.8 8.8   Liver Function Tests:  Recent Labs Lab 09/07/13 0742  AST 16  ALT 13  ALKPHOS 92  BILITOT 1.8*  PROT 7.3  ALBUMIN 3.8    Recent Labs Lab 09/07/13 0742  LIPASE 15   CBC:  Recent Labs Lab 09/07/13 0742  WBC 9.2  NEUTROABS 8.2*  HGB 14.4  HCT 42.0  MCV 92.1  PLT 181   Cardiac Enzymes:  Recent Labs Lab 09/07/13 0742  TROPONINI <0.30   BNP (last 3 results)  Recent Labs  03/02/13 1235 09/07/13 0742  PROBNP 588.1* 1089.0*   Studies: Dg Chest Port 1 View  09/07/2013   CLINICAL DATA:  Chest pain  EXAM: PORTABLE CHEST - 1 VIEW  COMPARISON:  03/02/2013  FINDINGS: The cardiac shadow is stable. Postsurgical changes are again identified. Some chronic  changes are noted in the left mid lung. No focal infiltrate is seen. No bony abnormality is noted.  IMPRESSION: No acute abnormality seen.   Electronically Signed   By: Alcide Clever M.D.   On: 09/07/2013 07:03    Scheduled Meds: . albuterol  2.5 mg Nebulization TID  . antiseptic oral rinse  15 mL Mouth Rinse BID  . atorvastatin  40 mg Oral q1800  . citalopram  10 mg Oral Daily  . diazepam  2.5 mg Oral QHS  . felodipine  5 mg Oral Daily  . gabapentin  300 mg Oral BID  . [START ON 09/09/2013] levofloxacin  500 mg Intravenous Q48H  . lisinopril  10 mg Oral Daily  . potassium chloride  10 mEq Oral BID  . rOPINIRole  0.125 mg Oral q morning - 10a   And  . rOPINIRole  0.25 mg Oral QHS  . Warfarin - Pharmacist Dosing Inpatient   Does not apply q1800   Continuous Infusions:   Principal Problem:   Acute respiratory failure Active Problems:   HTN (hypertension)   S/P aortic valve repair   CAD (coronary artery disease)   PE (pulmonary embolism)   Weakness generalized   Parkinson's disease   Hypokalemia   Acute bronchitis   Nausea and vomiting   CKD (chronic kidney disease), stage III   Acute on chronic combined systolic and diastolic heart failure  Time spent: 35  Pamella Pert, MD [facility] Pager [phone]. If 7 PM - 7 AM, please contact night-coverage at [email or web address], password [facility] 09/08/2013, 1:02 PM  LOS: 1 day

## 2013-09-08 NOTE — Consult Note (Signed)
PHARMACY CONSULT NOTE  Pharmacy Consult:  Coumadin Indication: History of PE  Allergies: No Known Allergies  Height/Weight: Height: 4\' 11"  (149.9 cm) Weight: 137 lb 2 oz (62.2 kg) IBW/kg (Calculated) : 43.2 Dosing weight 63 kg  Vital Signs: BP 158/79  Pulse 77  Temp(Src) 98.8 F (37.1 C) (Oral)  Resp 17  Ht 4\' 11"  (1.499 m)  Wt 137 lb 2 oz (62.2 kg)  BMI 27.68 kg/m2  SpO2 98%  Active Problems: Principal Problem:   Acute respiratory failure Active Problems:   HTN (hypertension)   S/P aortic valve repair   CAD (coronary artery disease)   PE (pulmonary embolism)   Weakness generalized   Parkinson's disease   Hypokalemia   Acute bronchitis   Nausea and vomiting   CKD (chronic kidney disease), stage III   Acute on chronic combined systolic and diastolic heart failure   Labs:  Recent Labs  09/07/13 0742 09/08/13 0350  HGB 14.4  --   HCT 42.0  --   PLT 181  --   LABPROT 34.5* 41.1*  INR 3.59* 4.52*  CREATININE 1.21* 1.06   Lab Results  Component Value Date   INR 4.52* 09/08/2013   INR 3.59* 09/07/2013   INR 1.84* 03/05/2013   Estimated Creatinine Clearance: 30.6 ml/min (by C-G formula based on Cr of 1.06).  Medical / Surgical History: Past Medical History  Diagnosis Date  . Hypertension   . Vertigo     "it's gotten better" (03/02/2013)  . CAD (coronary artery disease) 2000    s/p CABG   . Hyperlipidemia   . Scoliosis   . Vertebral artery stenosis 2006    75-90% per MR/MRA in 2006   . Pulmonary embolism 03/02/2013    "bilaterally; small" (03/02/2013)  . Exertional shortness of breath     "recently" (03/02/2013)  . Daily headache     "sometimes" (03/02/2013)  . GERD (gastroesophageal reflux disease)     "used to be bad; not so anymore" (03/02/2013)  . Arthritis     "in her back" (03/02/2013)  . TIA (transient ischemic attack) 07/2011  . Chronic lower back pain   . Anxiety   . Parkinson disease     "dx'd just 2 wk ago" (09/07/2013)   Past Surgical  History  Procedure Laterality Date  . Aortic valve replacement  2000    "pig valve" (03/02/2013)  . Cholecystectomy  2000  . Vaginal hysterectomy  ~ 1987  . Coronary artery bypass graft  2000    "CABG X?" (03/02/2013)  . Cataract extraction w/ intraocular lens  implant, bilateral Bilateral 2005  . Cardiac valve replacement    . Coronary angioplasty  1989    Medications:  Prescriptions prior to admission  Medication Sig Dispense Refill  . acetaminophen (TYLENOL) 500 MG tablet Take 1,000 mg by mouth every 6 (six) hours as needed for moderate pain or headache.      Marland Kitchen atorvastatin (LIPITOR) 40 MG tablet Take 40 mg by mouth daily.        . citalopram (CELEXA) 10 MG tablet Take 10 mg by mouth daily.      . diazepam (VALIUM) 5 MG tablet Take 2.5 mg by mouth 2 (two) times daily. Take 1/2 tablet every morning and 1/2 tablet every evening at 5 pm.      . felodipine (PLENDIL) 5 MG 24 hr tablet Take 5 mg by mouth daily.        Marland Kitchen gabapentin (NEURONTIN) 300 MG capsule Take 300 mg  by mouth 2 (two) times daily. Take one capsule in the morning and 1 capsule at bedtime.      Marland Kitchen HYDROcodone-acetaminophen (NORCO) 7.5-325 MG per tablet Take 1 tablet by mouth every 8 (eight) hours as needed. For pain  30 tablet  0  . lisinopril (PRINIVIL,ZESTRIL) 10 MG tablet Take 1 tablet (10 mg total) by mouth daily.  30 tablet  0  . rOPINIRole (REQUIP) 0.25 MG tablet Take 0.125-0.25 mg by mouth 2 (two) times daily. Take 1/2 tablet every morning and 1 whole tablet in the evening at 5 pm.      . warfarin (COUMADIN) 2.5 MG tablet Take 2.5 mg by mouth daily. Take 2.5mg  on mon, wed and fri. Take 1.25mg  the rest of the week        albuterol 2.5 mg Nebulization TID  antiseptic oral rinse 15 mL Mouth Rinse BID  atorvastatin 40 mg Oral q1800  citalopram 10 mg Oral Daily  diazepam 2.5 mg Oral QHS  felodipine 5 mg Oral Daily  gabapentin 300 mg Oral BID  [START ON 09/09/2013] levofloxacin 500 mg Intravenous Q48H  lisinopril 10 mg  Oral Daily  potassium chloride 10 mEq Oral BID  rOPINIRole 0.125 mg Oral q morning - 10a  And     rOPINIRole 0.25 mg Oral QHS  Warfarin - Pharmacist Dosing Inpatient  Does not apply q1800    Assessment: 79 y.o.female with history of PE on chronic Coumadin admitted with hypoxia, acute resp failure, and bronchitis.  Pharmacy to manage Coumadin therapy.  - INR continue trending up to 4.52 today, though no coumadin given since admission.  - No new cbc today, no bleeding documented. - Home Coumadin dose is 2.5 mg MWF, 1.25 mg TTSS.  She took her last dose 11/3 in the evening. - Levaquin IV has been started to cover her bronchitis, adjusted dose for patient's renal function  Goal of Therapy:   INR 2-3      Plan:  - Hold Coumadin today. - Daily INR's, CBC.  Monitor for bleeding complications.   Bayard Hugger, PharmD, BCPS  Clinical Pharmacist  Pager: [phone]   09/08/2013,  10:55 AM

## 2013-09-08 NOTE — Progress Notes (Signed)
The patient states that she is feeling about the same this morning as last night.  She states that she feels dehydrated.  Her skin is tenting.

## 2013-09-08 NOTE — Progress Notes (Signed)
INITIAL NUTRITION ASSESSMENT  DOCUMENTATION CODES Per approved criteria  -Not Applicable   INTERVENTION: 1. Resource Breeze TID, 8 oz provides 250 kcal, 9 g protein.  2. Advance diet per MD  NUTRITION DIAGNOSIS: Inadequate oral intake related to vomiting as evidenced by Clear liquid diet.   Goal: Patient will meet >/=90% of estimated nutrition needs  Monitor:  Diet advancement, PO intake, weight, labs  Reason for Assessment: Malnutrition screening tool  79 y.o. female  Admitting Dx: Acute respiratory failure  ASSESSMENT: Patient with history of CKD stage 3, hypertension, hyperlipidemia, and Parkinson's disease, admitted with vomiting, weakness for the last few days. Per patient and family, she was eating very well up until the last few days before admission. She is currently tolerating 25-50% of clear liquid diet with no vomiting. Patient has lost up to 6% of her UBW in the last 6 months, but this is not significant.   Height: Ht Readings from Last 1 Encounters:  09/07/13 4\' 11"  (1.499 m)    Weight: Wt Readings from Last 1 Encounters:  09/08/13 137 lb 2 oz (62.2 kg)    Ideal Body Weight: 95 pounds  % Ideal Body Weight: 144%  Wt Readings from Last 10 Encounters:  09/08/13 137 lb 2 oz (62.2 kg)  03/05/13 144 lb 1.6 oz (65.363 kg)  10/08/11 122 lb 9.2 oz (55.6 kg)  09/06/11 131 lb 13.4 oz (59.8 kg)  09/05/11 123 lb (55.792 kg)    Usual Body Weight: 140-145 pounds  % Usual Body Weight: 94-98%  BMI:  Body mass index is 27.68 kg/(m^2). Patient is overweight.   Estimated Nutritional Needs: Kcal: 1200-1350 kcal Protein: 65-75 g Fluid: >1.8 L/day  Skin: intact  Diet Order: Clear Liquid  EDUCATION NEEDS: -No education needs identified at this time   Intake/Output Summary (Last 24 hours) at 09/08/13 1415 Last data filed at 09/08/13 1100  Gross per 24 hour  Intake    810 ml  Output    750 ml  Net     60 ml    Last BM: PTA   Labs:   Recent  Labs Lab 09/07/13 0742 09/08/13 0350  NA 139 141  K 3.4* 3.7  CL 101 103  CO2 22 28  BUN 21 19  CREATININE 1.21* 1.06  CALCIUM 8.8 8.8  GLUCOSE 201* 114*    CBG (last 3)  No results found for this basename: GLUCAP,  in the last 72 hours  Scheduled Meds: . albuterol  2.5 mg Nebulization TID  . antiseptic oral rinse  15 mL Mouth Rinse BID  . atorvastatin  40 mg Oral q1800  . citalopram  10 mg Oral Daily  . diazepam  2.5 mg Oral QHS  . felodipine  5 mg Oral Daily  . furosemide  40 mg Intravenous Once  . gabapentin  300 mg Oral BID  . [START ON 09/09/2013] levofloxacin  500 mg Intravenous Q48H  . lisinopril  10 mg Oral Daily  . potassium chloride  10 mEq Oral BID  . rOPINIRole  0.125 mg Oral q morning - 10a   And  . rOPINIRole  0.25 mg Oral QHS  . Warfarin - Pharmacist Dosing Inpatient   Does not apply q1800    Continuous Infusions:   Past Medical History  Diagnosis Date  . Hypertension   . Vertigo     "it's gotten better" (03/02/2013)  . CAD (coronary artery disease) 2000    s/p CABG   . Hyperlipidemia   . Scoliosis   .  Vertebral artery stenosis 2006    75-90% per MR/MRA in 2006   . Pulmonary embolism 03/02/2013    "bilaterally; small" (03/02/2013)  . Exertional shortness of breath     "recently" (03/02/2013)  . Daily headache     "sometimes" (03/02/2013)  . GERD (gastroesophageal reflux disease)     "used to be bad; not so anymore" (03/02/2013)  . Arthritis     "in her back" (03/02/2013)  . TIA (transient ischemic attack) 07/2011  . Chronic lower back pain   . Anxiety   . Parkinson disease     "dx'd just 2 wk ago" (09/07/2013)    Past Surgical History  Procedure Laterality Date  . Aortic valve replacement  2000    "pig valve" (03/02/2013)  . Cholecystectomy  2000  . Vaginal hysterectomy  ~ 1987  . Coronary artery bypass graft  2000    "CABG X?" (03/02/2013)  . Cataract extraction w/ intraocular lens  implant, bilateral Bilateral 2005  . Cardiac valve  replacement    . Coronary angioplasty  1989    Linnell Fulling, RD, LDN Pager #: [phone] After-Hours Pager #: [phone]

## 2013-09-08 NOTE — Progress Notes (Signed)
Pt is alert and oriented x4. Pt is on 2L [place]. Pt stated that "I feel better than I felt this morning." Daughter-in-law is at bedside. Pt has been able to keep breakfast and lunch down. Pt is not complaining of any nausea. Pt is currently sitting in the chair. Will continue to monitor

## 2013-09-08 NOTE — Progress Notes (Signed)
Physical Therapy Evaluation Patient Details Name: Kayla Ramos MRN: [id number] DOB: 11/06/1924 Today's Date: 09/08/2013 Time: 4098-1191 PT Time Calculation (min): 22 min  PT Assessment / Plan / Recommendation History of Present Illness  Pt admit with respiratory failure, vomiting and weakness.    Clinical Impression  Pt admitted with above. Pt currently with functional limitations due to the deficits listed below (see PT Problem List). Pt has 24 hour care at home.  Will need HHPT f/u.  Pt will benefit from skilled PT to increase their independence and safety with mobility to allow discharge to the venue listed below.   PT Assessment  Patient needs continued PT services    Follow Up Recommendations  Home health PT;Supervision/Assistance - 24 hour                Equipment Recommendations  None recommended by PT         Frequency Min 3X/week    Precautions / Restrictions Precautions Precautions: Fall Restrictions Weight Bearing Restrictions: No   Pertinent Vitals/Pain VSS, No pain      Mobility  Bed Mobility Bed Mobility: Rolling Left;Left Sidelying to Sit;Sitting - Scoot to Edge of Bed Rolling Left: 1: +2 Total assist Rolling Left: Patient Percentage: 40% Left Sidelying to Sit: 1: +2 Total assist;HOB flat Left Sidelying to Sit: Patient Percentage: 40% Sitting - Scoot to Edge of Bed: 2: Max assist Details for Bed Mobility Assistance: Pt needed assist to move LEs off bed and to elevate trunk. Transfers Transfers: Sit to Stand;Stand to Sit;Stand Pivot Transfers Sit to Stand: 1: +2 Total assist;With upper extremity assist;From bed Sit to Stand: Patient Percentage: 50% Stand to Sit: 1: +2 Total assist;With upper extremity assist;To chair/3-in-1;With armrests Stand to Sit: Patient Percentage: 50% Stand Pivot Transfers: 1: +2 Total assist Stand Pivot Transfers: Patient Percentage: 50% Details for Transfer Assistance: Pt needed 2 person assist to get to chair.  Able to  pivot with assist for weight shifting.  Posterior lean.   Ambulation/Gait Ambulation/Gait Assistance: Not tested (comment) Stairs: No Wheelchair Mobility Wheelchair Mobility: No         PT Diagnosis: Generalized weakness  PT Problem List: Decreased activity tolerance;Decreased balance;Decreased knowledge of use of DME;Decreased safety awareness;Decreased knowledge of precautions PT Treatment Interventions: DME instruction;Gait training;Therapeutic activities;Therapeutic exercise;Balance training;Patient/family education;Stair training;Functional mobility training     PT Goals(Current goals can be found in the care plan section) Acute Rehab PT Goals Patient Stated Goal: to go  home PT Goal Formulation: With patient Time For Goal Achievement: 09/15/13 Potential to Achieve Goals: Good  Visit Information  Last PT Received On: 09/08/13 Assistance Needed: +2 (for ambulation) History of Present Illness: Pt admit with respiratory failure, vomiting and weakness.         Prior Functioning  Home Living Family/patient expects to be discharged to:: Private residence Living Arrangements: Children;Non-relatives/Friends Available Help at Discharge: Available 24 hours/day;Family;Personal care attendant Type of Home: House Home Access: Stairs to enter [employer] of Steps: 5-6 Entrance Stairs-Rails: Can reach both Home Layout: One level Home Equipment: Bedside commode;Grab bars - tub/shower;Toilet riser;Walker - 4 wheels;Shower seat;Hospital bed;Wheelchair - manual Prior Function Level of Independence: Needs assistance Gait / Transfers Assistance Needed: Ambulated to bathroom and back with RW but always had attendance, help to stand up off commode and get OOB ADL's / Homemaking Assistance Needed: mod assist with bathing, total assist with dresssing. Communication Communication: No difficulties Dominant Hand: Right    Cognition  Cognition Arousal/Alertness:  Awake/alert Behavior During Therapy:  WFL for tasks assessed/performed Overall Cognitive Status: History of cognitive impairments - at baseline    Extremity/Trunk Assessment Upper Extremity Assessment Upper Extremity Assessment: Defer to OT evaluation Lower Extremity Assessment Lower Extremity Assessment: Generalized weakness Cervical / Trunk Assessment Cervical / Trunk Assessment: Kyphotic   Balance Balance Balance Assessed: Yes Static Sitting Balance Static Sitting - Balance Support: Bilateral upper extremity supported;Feet supported Static Sitting - Level of Assistance: 3: Mod assist Static Sitting - Comment/# of Minutes: 2 min with posterior lean. Static Standing Balance Static Standing - Balance Support: Bilateral upper extremity supported;During functional activity Static Standing - Level of Assistance: 1: +2 Total assist Static Standing - Comment/# of Minutes: pt = 50 % to stand statically for 1 min to pivot to chair.   End of Session PT - End of Session Equipment Utilized During Treatment: Gait belt;Oxygen Activity Tolerance: Patient limited by fatigue Patient left: in chair;with call bell/phone within reach;with family/visitor present Nurse Communication: Mobility status       INGOLD,Madonna Flegal 09/08/2013, 2:04 PM  [facility] [phone] (pager)

## 2013-09-08 NOTE — Progress Notes (Signed)
The patient complained of nausea at the beginning of the shift and was given Zofran.  After receiving Valium, she was able to sleep a large part of the night.  She appears weak, but did not have any acute changes during the shift.

## 2013-09-09 LAB — CBC
HCT: 38.3 % (ref 36.0–46.0)
MCH: 31.5 pg (ref 26.0–34.0)
MCHC: 33.4 g/dL (ref 30.0–36.0)
MCV: 94.3 fL (ref 78.0–100.0)
Platelets: 174 10*3/uL (ref 150–400)
RDW: 14.1 % (ref 11.5–15.5)
WBC: 7 10*3/uL (ref 4.0–10.5)

## 2013-09-09 LAB — BASIC METABOLIC PANEL
BUN: 19 mg/dL (ref 6–23)
CO2: 25 mEq/L (ref 19–32)
Chloride: 104 mEq/L (ref 96–112)
Creatinine, Ser: 1.09 mg/dL (ref 0.50–1.10)

## 2013-09-09 MED ORDER — ALBUTEROL SULFATE (5 MG/ML) 0.5% IN NEBU: 2.5000 mg | INHALATION_SOLUTION | RESPIRATORY_TRACT | Status: DC | PRN

## 2013-09-09 NOTE — Progress Notes (Signed)
Physical Therapy Treatment Patient Details Name: Kayla Ramos MRN: [id number] DOB: 1925/10/11 Today's Date: 09/09/2013 Time: 4098-1191 PT Time Calculation (min): 25 min  PT Assessment / Plan / Recommendation  History of Present Illness Pt admit with respiratory failure, vomiting and weakness.     PT Comments   Pt progressing with mobility and able to ambulate today on RA. Sats 90-97% throughout on RA without pain or SOB. Pt and dgtr-in-law educated for sequence of transfers to ease burden of care and increase pt effort. Will continue to follow.   Follow Up Recommendations  Home health PT;Supervision/Assistance - 24 hour     Does the patient have the potential to tolerate intense rehabilitation     Barriers to Discharge        Equipment Recommendations       Recommendations for Other Services    Frequency     Progress towards PT Goals Progress towards PT goals: Progressing toward goals  Plan Current plan remains appropriate    Precautions / Restrictions Precautions Precautions: Fall   Pertinent Vitals/Pain No pain    Mobility  Bed Mobility Bed Mobility: Rolling Right;Rolling Left;Left Sidelying to Sit;Sitting - Scoot to [place] of Bed Rolling Right: 3: Mod assist Rolling Left: 3: Mod assist Left Sidelying to Sit: 3: Mod assist;HOB flat Details for Bed Mobility Assistance: cueing for sequence and use of rail to roll , assist to move legs to EOB and elevate trunk Transfers Sit to Stand: 3: Mod assist;From bed;From chair/3-in-1;With armrests Stand to Sit: 4: Min assist;With armrests;To chair/3-in-1 Details for Transfer Assistance: cueing for hand placement, sequence and safety with max cues to push off of surface to stand and reach for surface to sit, assist for anterior translation Ambulation/Gait Ambulation/Gait Assistance: 1: +2 Total assist Ambulation/Gait: Patient Percentage: 70% Ambulation Distance (Feet): 20 Feet (6', 20', 22' respectively with seated rest between  trials) Assistive device: 4-wheeled walker Ambulation/Gait Assistance Details: cueing for position close to walker with cues to look up and stand as erect as possible Gait Pattern: Shuffle;Trunk flexed;Narrow base of support Gait velocity: decreased Stairs: No    Exercises General Exercises - Lower Extremity Long Arc Quad: AROM;Both;10 reps;Seated Hip ABduction/ADduction: AAROM;Seated;Both;15 reps Hip Flexion/Marching: AROM;Seated;Both;15 reps Toe Raises: AROM;Seated;Both;15 reps   PT Diagnosis:    PT Problem List:   PT Treatment Interventions:     PT Goals (current goals can now be found in the care plan section)    Visit Information  Last PT Received On: 09/09/13 Assistance Needed: +2 (safety with gait) History of Present Illness: Pt admit with respiratory failure, vomiting and weakness.      Subjective Data      Cognition  Cognition Arousal/Alertness: Awake/alert Behavior During Therapy: WFL for tasks assessed/performed Overall Cognitive Status: History of cognitive impairments - at baseline    Balance     End of Session PT - End of Session Equipment Utilized During Treatment: Gait belt Activity Tolerance: Patient tolerated treatment well Patient left: in chair;with call bell/phone within reach;with family/visitor present;with nursing/sitter in room Nurse Communication: Mobility status   GP     Delorse Lek 09/09/2013, 12:27 PM Delaney Meigs, PT [phone]

## 2013-09-09 NOTE — Progress Notes (Signed)
[facility] PROGRESS NOTE  Kayla Ramos:[id number] DOB: June 08, 1925 DOA: 09/07/2013 PCP: Linus Orn  HPI: Kayla Ramos is a 79 y.o. female presents with several day h/o cough productive of clear liquid. Vomiting. Weakness. No dyspnea, f/c. No diarrhea. In ED, sats high 80s. CXR negative. proBNP 1000. 500 earlier this year. Echo earlier this year showed EF 45-50%. Walks with walker. Has 24 aide. Hhn, lasix in ed  Assessment/Plan: Acute respiratory failure secondary to acute bronchitis, bronchospasm, mild chf: hhn, iv levaquin.  - Lasix x1 in the ED; still on oxygen.  - On Levofloxacin Acute on chronic combined systolic and diastolic heart failure - stable. Last 2D echo May, EF 45-50%, grade 1 diastolic dysfunction. HTN (hypertension)  S/P aortic valve repair  CAD (coronary artery disease)  PE (pulmonary embolism): supratherapeur\tic coumadin  Weakness generalized: pt eval  - encourage po intake Parkinson's disease  Hypokalemia: replete  Acute bronchitis  Nausea and vomiting: clears. ADAT  CKD (chronic kidney disease), stage III   Diet: clear liquid, advance as tolerated Fluids: none DVT Prophylaxis: on Coumadin  Code Status: Full Family Communication: none  Disposition Plan: inpatient, home 11/7  Consultants:  none  Procedures:  none   Antibiotics  Anti-infectives   Start     Dose/Rate Route Frequency Ordered Stop   09/09/13 1000  levofloxacin (LEVAQUIN) IVPB 500 mg     500 mg 100 mL/hr over 60 Minutes Intravenous Every 48 hours 09/07/13 1337     09/07/13 1345  levofloxacin (LEVAQUIN) IVPB 750 mg     750 mg 100 mL/hr over 90 Minutes Intravenous NOW 09/07/13 1336 09/07/13 1602   09/07/13 1300  levofloxacin (LEVAQUIN) IVPB 500 mg  Status:  Discontinued     500 mg 100 mL/hr over 60 Minutes Intravenous Every 24 hours 09/07/13 1215 09/07/13 1335     Antibiotics Given (last 72 hours)   Date/Time Action Medication Dose Rate   09/07/13 1432 Given   levofloxacin (LEVAQUIN) IVPB 750 mg 750 mg 100 mL/hr   09/09/13 1113 Given  [busy with other pts]   levofloxacin (LEVAQUIN) IVPB 500 mg 500 mg 100 mL/hr      HPI/Subjective: - feeling better.  Objective: Filed Vitals:   09/09/13 0554 09/09/13 0808 09/09/13 1050 09/09/13 1221  BP: 140/60  137/62   Pulse: 71 77  65  Temp: 99.2 F (37.3 C)     TempSrc: Oral     Resp: 17 18    Height:      Weight: 60.9 kg (134 lb 4.2 oz)     SpO2: 94% 97%  95%    Intake/Output Summary (Last 24 hours) at 09/09/13 1304 Last data filed at 09/09/13 0900  Gross per 24 hour  Intake    240 ml  Output    250 ml  Net    -10 ml   Filed Weights   09/07/13 1118 09/08/13 0635 09/09/13 0554  Weight: 63.2 kg (139 lb 5.3 oz) 62.2 kg (137 lb 2 oz) 60.9 kg (134 lb 4.2 oz)    Exam:   General:  NAD  Cardiovascular: regular rate and rhythm, without MRG  Respiratory: good air movement, clear to auscultation throughout, no wheezing, ronchi or rales  Abdomen: soft, not tender to palpation, positive bowel sounds  MSK: no peripheral edema  Neuro: grossly non focal; resting tremor present  Data Reviewed: Basic Metabolic Panel:  Recent Labs Lab 09/07/13 0742 09/08/13 0350 09/09/13 0500  NA 139 141 139  K 3.4* 3.7 4.7  CL 101 103 104  CO2 22 28 25   GLUCOSE 201* 114* 117*  BUN 21 19 19   CREATININE 1.21* 1.06 1.09  CALCIUM 8.8 8.8 8.7   Liver Function Tests:  Recent Labs Lab 09/07/13 0742  AST 16  ALT 13  ALKPHOS 92  BILITOT 1.8*  PROT 7.3  ALBUMIN 3.8    Recent Labs Lab 09/07/13 0742  LIPASE 15   CBC:  Recent Labs Lab 09/07/13 0742 09/09/13 0500  WBC 9.2 7.0  NEUTROABS 8.2*  --   HGB 14.4 12.8  HCT 42.0 38.3  MCV 92.1 94.3  PLT 181 174   Cardiac Enzymes:  Recent Labs Lab 09/07/13 0742  TROPONINI <0.30   BNP (last 3 results)  Recent Labs  03/02/13 1235 09/07/13 0742  PROBNP 588.1* 1089.0*   Studies: No results found.  Scheduled Meds: . antiseptic oral  rinse  15 mL Mouth Rinse BID  . atorvastatin  40 mg Oral q1800  . citalopram  10 mg Oral Daily  . diazepam  2.5 mg Oral QHS  . feeding supplement (RESOURCE BREEZE)  1 Container Oral TID BM  . felodipine  5 mg Oral Daily  . furosemide  40 mg Intravenous Once  . gabapentin  300 mg Oral BID  . levofloxacin  500 mg Intravenous Q48H  . lisinopril  10 mg Oral Daily  . potassium chloride  10 mEq Oral BID  . rOPINIRole  0.125 mg Oral q morning - 10a   And  . rOPINIRole  0.25 mg Oral QHS  . Warfarin - Pharmacist Dosing Inpatient   Does not apply q1800   Continuous Infusions:   Principal Problem:   Acute respiratory failure Active Problems:   HTN (hypertension)   S/P aortic valve repair   CAD (coronary artery disease)   PE (pulmonary embolism)   Weakness generalized   Parkinson's disease   Hypokalemia   Acute bronchitis   Nausea and vomiting   CKD (chronic kidney disease), stage III   Acute on chronic combined systolic and diastolic heart failure  Time spent: 25  Pamella Pert, MD [facility] Pager [phone]. If 7 PM - 7 AM, please contact night-coverage at [email or web address], password [facility] 09/09/2013, 1:04 PM  LOS: 2 days

## 2013-09-09 NOTE — Consult Note (Signed)
PHARMACY CONSULT NOTE  Pharmacy Consult:  Coumadin Indication: History of PE  Allergies: No Known Allergies  Height/Weight: Height: 4\' 11"  (149.9 cm) Weight: 134 lb 4.2 oz (60.9 kg) IBW/kg (Calculated) : 43.2 Dosing weight 63 kg  Vital Signs: BP 137/62  Pulse 77  Temp(Src) 99.2 F (37.3 C) (Oral)  Resp 18  Ht 4\' 11"  (1.499 m)  Wt 134 lb 4.2 oz (60.9 kg)  BMI 27.10 kg/m2  SpO2 97%   Labs:  Recent Labs  09/07/13 0742 09/08/13 0350 09/09/13 0500  HGB 14.4  --  12.8  HCT 42.0  --  38.3  PLT 181  --  174  LABPROT 34.5* 41.1* 34.0*  INR 3.59* 4.52* 3.52*  CREATININE 1.21* 1.06 1.09   Lab Results  Component Value Date   INR 3.52* 09/09/2013   INR 4.52* 09/08/2013   INR 3.59* 09/07/2013   Estimated Creatinine Clearance: 29.4 ml/min (by C-G formula based on Cr of 1.09).  Assessment: 79 y.o.female with history of PE on chronic Coumadin admitted with hypoxia, acute resp failure, and bronchitis.  Pharmacy to manage Coumadin therapy.  - INR remains supratherapeutic 3.52 < 4.52, though no coumadin given since admission.  - Hgb 12.8, plt 174 - Home Coumadin dose is 2.5 mg MWF, 1.25 mg TTSS.  She took her last dose 11/3 in the evening.  Goal of Therapy:   INR 2-3      Plan:  - Hold Coumadin today. - Daily INR's, CBC.  Monitor for bleeding complications.   Bayard Hugger, PharmD, BCPS  Clinical Pharmacist  Pager: [phone]

## 2013-09-10 MED ORDER — WARFARIN SODIUM 2.5 MG PO TABS
2.5000 mg | ORAL_TABLET | Freq: Once | ORAL | Status: DC
  Filled 2013-09-10: qty 1

## 2013-09-10 MED ORDER — LEVOFLOXACIN 750 MG PO TABS: 750.0000 mg | ORAL_TABLET | ORAL | Status: DC

## 2013-09-10 NOTE — Consult Note (Signed)
PHARMACY CONSULT NOTE  Pharmacy Consult:  Coumadin Indication: History of PE  Allergies: No Known Allergies  Height/Weight: Height: 4\' 11"  (149.9 cm) Weight: 135 lb 5.8 oz (61.4 kg) IBW/kg (Calculated) : 43.2 Dosing weight 63 kg  Vital Signs: BP 112/56  Pulse 64  Temp(Src) 99.2 F (37.3 C) (Axillary)  Resp 18  Ht 4\' 11"  (1.499 m)  Wt 135 lb 5.8 oz (61.4 kg)  BMI 27.33 kg/m2  SpO2 94%   Labs:  Recent Labs  09/08/13 0350 09/09/13 0500 09/10/13 0610  HGB  --  12.8  --   HCT  --  38.3  --   PLT  --  174  --   LABPROT 41.1* 34.0* 21.6*  INR 4.52* 3.52* 1.94*  CREATININE 1.06 1.09  --    Lab Results  Component Value Date   INR 1.94* 09/10/2013   INR 3.52* 09/09/2013   INR 4.52* 09/08/2013   Estimated Creatinine Clearance: 29.5 ml/min (by C-G formula based on Cr of 1.09).  Assessment: 79 y.o.female with history of PE on chronic Coumadin admitted with hypoxia, acute resp failure, and bronchitis. INR 3.59 on admission, no coumadin given so far, INR dropped to 1.94 this morning, No new cbc, No bleeding noted per chart. Plan for discharge today.  - Home Coumadin dose is 2.5 mg MWF, 1.25 mg TTSS.  She took her last dose 11/3 in the evening.  Goal of Therapy:   INR 2-3      Plan:  - Resume coumadin home dose today with 2.5 mg this evening - Daily INR's, CBC.  Monitor for bleeding complications.   Bayard Hugger, PharmD, BCPS  Clinical Pharmacist  Pager: [phone]

## 2013-09-10 NOTE — Care Management Note (Addendum)
  Page 2 of 2   09/10/2013     1:50:31 PM   CARE MANAGEMENT NOTE 09/10/2013  Patient:  Kayla Ramos, Kayla Ramos   Account Number:  [id number]  Date Initiated:  09/10/2013  Documentation initiated by:  [facility]  Subjective/Objective Assessment:   79 y.o. female presents with several day h/o cough productive of clear liquid.  Vomiting. Weakness. No dyspnea, f/c. No diarrhea.  In ED, sats high 80s. / home with 24 hour caregiver     Action/Plan:   IV abx/ home with home health   Anticipated DC Date:  09/10/2013   Anticipated DC Plan:  HOME W HOME HEALTH SERVICES  In-house referral  Clinical Social Worker      DC Associate Professor  CM consult      [facility] Choice  HOME HEALTH   Choice offered to / List presented to:  [name]        HH arranged  HH-2 PT  HH-6 SOCIAL WORKER      HH agency  [name]   Status of service:  Completed, signed off Medicare Important Message given?   (If response is "NO", the following Medicare IM given date fields will be blank) Date Medicare IM given:   Date Additional Medicare IM given:    Discharge Disposition:    Per UR Regulation:    If discussed at Long Length of Stay Meetings, dates discussed:    Comments:  09/10/13 1330 Oletta Cohn, RN, BSN, [employer] [phone] Spoke with pt at bedside regarding discharge planning for [name]. Offered pt list of home health agencies to choose from.  Pt chose [name] to render services of PT and SW. Kayla Ramos of [name] notified.  No DME needs identified at this time.

## 2013-09-10 NOTE — Progress Notes (Signed)
Pt d/c home with family. [facility] set up and Sc[facility] to see Pt 11-10 per case management. D/c instructions and medications reviewed  with Pt and granddaughter .  Pt and granddaughter  state understanding. All Pt and grand daughter's questions answered.

## 2013-09-10 NOTE — Progress Notes (Signed)
Pt in bed eating breakfast. Pt complained of HA, PRN giving. No complaints of SOB. Will continue to monitor

## 2013-09-10 NOTE — Discharge Summary (Signed)
Physician Discharge Summary  Kayla Ramos ZOX:[id number] DOB: [birth year] DOA: 09/07/2013  PCP: RECORD,CHARLES  Admit date: 09/07/2013 Discharge date: 09/10/2013  Time spent: 35 minutes  Recommendations for Outpatient Follow-up:  1. Follow up with PCP in 1 week   Discharge Diagnoses:  Principal Problem:   Acute respiratory failure Active Problems:   HTN (hypertension)   S/P aortic valve repair   CAD (coronary artery disease)   PE (pulmonary embolism)   Weakness generalized   Parkinson's disease   Hypokalemia   Acute bronchitis   Nausea and vomiting   CKD (chronic kidney disease), stage III   Acute on chronic combined systolic and diastolic heart failure  Discharge Condition: stable  Diet recommendation: heart healthy  Filed Weights   09/08/13 0635 09/09/13 0554 09/10/13 0513  Weight: 62.2 kg (137 lb 2 oz) 60.9 kg (134 lb 4.2 oz) 61.4 kg (135 lb 5.8 oz)   History of present illness:  Kayla Ramos is a 79 y.o. female presents with several day h/o cough productive of clear liquid. Vomiting. Weakness. No dyspnea, f/c. No diarrhea. In ED, sats high 80s. CXR negative. proBNP 1000. 500 earlier this year. Echo earlier this year showed EF 45-50%. Walks with walker. Has 24 aide. Hhn, lasix in ed  Hospital Course:  Acute respiratory failure secondary to acute bronchitis, bronchospasm, mild chf: hhn, iv levaquin. Lasix x1 in the ED. Mildly intravascularly depleted initially. She was started on Levofloxacin with improvement in her cough/respiratory status. Her nausea has improved and she was able to tolerate regular diet without problems. She was completely weaned off her oxygen and was comfortable on room air. She is to continue Levofloxacin for 1 additional dose.  Acute on chronic combined systolic and diastolic heart failure  - stable. Last 2D echo May, EF 45-50%, grade 1 diastolic dysfunction.  HTN (hypertension)  S/P aortic valve repair  CAD (coronary artery disease)  PE (pulmonary  embolism): supratherapeur\tic coumadin. Counseled to skip tonight's dose and take small 1.25 mg Coumadin dose over the weekend and have her INR checked on Monday morning.  Weakness generalized: pt eval. Weakness improved significantly once able to tolerate a diet.  Parkinson's disease  Hypokalemia: replete  Acute bronchitis  Nausea and vomiting: clears. ADAT  CKD (chronic kidney disease), stage III   Procedures:  none   Consultations:  none  Discharge Exam: Filed Vitals:   09/09/13 2018 09/10/13 0513 09/10/13 1024 09/10/13 1341  BP: 134/66 144/69 112/56 97/84  Pulse: 72 64  65  Temp: 98.3 F (36.8 C) 99.2 F (37.3 C)  97.9 F (36.6 C)  TempSrc: Oral Axillary  Oral  Resp: 18 18  18   Height:      Weight:  61.4 kg (135 lb 5.8 oz)    SpO2: 92% 94%  98%    General: NAD Cardiovascular: RRR Respiratory: CTA biL  Discharge Instructions     Medication List         acetaminophen 500 MG tablet  Commonly known as:  TYLENOL  Take 1,000 mg by mouth every 6 (six) hours as needed for moderate pain or headache.     atorvastatin 40 MG tablet  Commonly known as:  LIPITOR  Take 40 mg by mouth daily.     citalopram 10 MG tablet  Commonly known as:  CELEXA  Take 10 mg by mouth daily.     diazepam 5 MG tablet  Commonly known as:  VALIUM  Take 2.5 mg by mouth 2 (two) times daily. Take  1/2 tablet every morning and 1/2 tablet every evening at 5 pm.     felodipine 5 MG 24 hr tablet  Commonly known as:  PLENDIL  Take 5 mg by mouth daily.     gabapentin 300 MG capsule  Commonly known as:  NEURONTIN  Take 300 mg by mouth 2 (two) times daily. Take one capsule in the morning and 1 capsule at bedtime.     HYDROcodone-acetaminophen 7.5-325 MG per tablet  Commonly known as:  NORCO  Take 1 tablet by mouth every 8 (eight) hours as needed. For pain     levofloxacin 750 MG tablet  Commonly known as:  LEVAQUIN  Take 1 tablet (750 mg total) by mouth every other day. Please take one  more dose on 09/11/2013.     lisinopril 10 MG tablet  Commonly known as:  PRINIVIL,ZESTRIL  Take 1 tablet (10 mg total) by mouth daily.     rOPINIRole 0.25 MG tablet  Commonly known as:  REQUIP  Take 0.125-0.25 mg by mouth 2 (two) times daily. Take 1/2 tablet every morning and 1 whole tablet in the evening at 5 pm.     warfarin 2.5 MG tablet  Commonly known as:  COUMADIN  Take 2.5 mg by mouth daily. Take 2.5mg  on mon, wed and fri. Take 1.25mg  the rest of the week           Follow-up Information   Follow up with RECORD,CHARLES. Schedule an appointment as soon as possible for a visit in 4 days. (09-14-13 @1015  am)    Specialty:  Family Medicine   Contact information:   [address] [place] [address] [phone]       Follow up with [facility]. (Home Health Physical Therapy and Social Worker)    Contact information:   [phone]     The results of significant diagnostics from this hospitalization (including imaging, microbiology, ancillary and laboratory) are listed below for reference.    Significant Diagnostic Studies: Dg Chest Port 1 View  09/07/2013   CLINICAL DATA:  Chest pain  EXAM: PORTABLE CHEST - 1 VIEW  COMPARISON:  03/02/2013  FINDINGS: The cardiac shadow is stable. Postsurgical changes are again identified. Some chronic changes are noted in the left mid lung. No focal infiltrate is seen. No bony abnormality is noted.  IMPRESSION: No acute abnormality seen.   Electronically Signed   By: Alcide Clever M.D.   On: 09/07/2013 07:03   Labs: Basic Metabolic Panel:  Recent Labs Lab 09/07/13 0742 09/08/13 0350 09/09/13 0500  NA 139 141 139  K 3.4* 3.7 4.7  CL 101 103 104  CO2 22 28 25   GLUCOSE 201* 114* 117*  BUN 21 19 19   CREATININE 1.21* 1.06 1.09  CALCIUM 8.8 8.8 8.7   Liver Function Tests:  Recent Labs Lab 09/07/13 0742  AST 16  ALT 13  ALKPHOS 92  BILITOT 1.8*  PROT 7.3  ALBUMIN 3.8    Recent Labs Lab 09/07/13 0742   LIPASE 15   CBC:  Recent Labs Lab 09/07/13 0742 09/09/13 0500  WBC 9.2 7.0  NEUTROABS 8.2*  --   HGB 14.4 12.8  HCT 42.0 38.3  MCV 92.1 94.3  PLT 181 174   Cardiac Enzymes:  Recent Labs Lab 09/07/13 0742  TROPONINI <0.30   BNP: BNP (last 3 results)  Recent Labs  03/02/13 1235 09/07/13 0742  PROBNP 588.1* 1089.0*   Signed:  Pamella Pert  [facility] 09/10/2013, 6:54 PM

## 2013-09-11 NOTE — Progress Notes (Signed)
The patient was discharged via ambulance at 1940.

## 2013-09-17 ENCOUNTER — Encounter (HOSPITAL_COMMUNITY): Payer: Self-pay | Admitting: Emergency Medicine

## 2013-09-17 ENCOUNTER — Emergency Department (HOSPITAL_COMMUNITY): Payer: Medicare Other

## 2013-09-17 ENCOUNTER — Emergency Department (HOSPITAL_COMMUNITY)
Admission: EM | Admit: 2013-09-17 | Discharge: 2013-09-17 | Disposition: A | Discharge status: Home / Self Care | Payer: Medicare Other | Attending: Emergency Medicine | Admitting: Emergency Medicine

## 2013-09-17 DIAGNOSIS — R011 Cardiac murmur, unspecified: Secondary | ICD-10-CM | POA: Insufficient documentation

## 2013-09-17 DIAGNOSIS — Z7901 Long term (current) use of anticoagulants: Secondary | ICD-10-CM | POA: Insufficient documentation

## 2013-09-17 DIAGNOSIS — Z8673 Personal history of transient ischemic attack (TIA), and cerebral infarction without residual deficits: Secondary | ICD-10-CM | POA: Insufficient documentation

## 2013-09-17 DIAGNOSIS — Z954 Presence of other heart-valve replacement: Secondary | ICD-10-CM | POA: Insufficient documentation

## 2013-09-17 DIAGNOSIS — I2510 Atherosclerotic heart disease of native coronary artery without angina pectoris: Secondary | ICD-10-CM | POA: Insufficient documentation

## 2013-09-17 DIAGNOSIS — F411 Generalized anxiety disorder: Secondary | ICD-10-CM | POA: Insufficient documentation

## 2013-09-17 DIAGNOSIS — R059 Cough, unspecified: Secondary | ICD-10-CM | POA: Insufficient documentation

## 2013-09-17 DIAGNOSIS — G8929 Other chronic pain: Secondary | ICD-10-CM | POA: Insufficient documentation

## 2013-09-17 DIAGNOSIS — E785 Hyperlipidemia, unspecified: Secondary | ICD-10-CM | POA: Insufficient documentation

## 2013-09-17 DIAGNOSIS — Z79899 Other long term (current) drug therapy: Secondary | ICD-10-CM | POA: Insufficient documentation

## 2013-09-17 DIAGNOSIS — M129 Arthropathy, unspecified: Secondary | ICD-10-CM | POA: Insufficient documentation

## 2013-09-17 DIAGNOSIS — R05 Cough: Secondary | ICD-10-CM

## 2013-09-17 DIAGNOSIS — Z8739 Personal history of other diseases of the musculoskeletal system and connective tissue: Secondary | ICD-10-CM | POA: Insufficient documentation

## 2013-09-17 DIAGNOSIS — G20 Parkinson's disease: Secondary | ICD-10-CM | POA: Insufficient documentation

## 2013-09-17 DIAGNOSIS — Z9861 Coronary angioplasty status: Secondary | ICD-10-CM | POA: Insufficient documentation

## 2013-09-17 DIAGNOSIS — I10 Essential (primary) hypertension: Secondary | ICD-10-CM | POA: Insufficient documentation

## 2013-09-17 DIAGNOSIS — Z8719 Personal history of other diseases of the digestive system: Secondary | ICD-10-CM | POA: Insufficient documentation

## 2013-09-17 DIAGNOSIS — Z951 Presence of aortocoronary bypass graft: Secondary | ICD-10-CM | POA: Insufficient documentation

## 2013-09-17 DIAGNOSIS — G20A1 Parkinson's disease without dyskinesia, without mention of fluctuations: Secondary | ICD-10-CM | POA: Insufficient documentation

## 2013-09-17 DIAGNOSIS — Z86711 Personal history of pulmonary embolism: Secondary | ICD-10-CM | POA: Insufficient documentation

## 2013-09-17 DIAGNOSIS — R112 Nausea with vomiting, unspecified: Secondary | ICD-10-CM | POA: Insufficient documentation

## 2013-09-17 LAB — URINALYSIS, ROUTINE W REFLEX MICROSCOPIC
Ketones, ur: 15 mg/dL — AB
Protein, ur: NEGATIVE mg/dL
Urobilinogen, UA: 0.2 mg/dL (ref 0.0–1.0)

## 2013-09-17 LAB — CBC WITH DIFFERENTIAL/PLATELET
Eosinophils Relative: 0 % (ref 0–5)
HCT: 40.8 % (ref 36.0–46.0)
Lymphocytes Relative: 4 % — ABNORMAL LOW (ref 12–46)
Lymphs Abs: 0.4 10*3/uL — ABNORMAL LOW (ref 0.7–4.0)
MCV: 91.9 fL (ref 78.0–100.0)
Monocytes Absolute: 0.2 10*3/uL (ref 0.1–1.0)
Neutrophils Relative %: 94 % — ABNORMAL HIGH (ref 43–77)
Platelets: 241 10*3/uL (ref 150–400)
RBC: 4.44 MIL/uL (ref 3.87–5.11)
WBC: 10.5 10*3/uL (ref 4.0–10.5)

## 2013-09-17 LAB — COMPREHENSIVE METABOLIC PANEL
ALT: 15 U/L (ref 0–35)
Alkaline Phosphatase: 86 U/L (ref 39–117)
CO2: 25 mEq/L (ref 19–32)
Calcium: 8.8 mg/dL (ref 8.4–10.5)
Creatinine, Ser: 1.06 mg/dL (ref 0.50–1.10)
GFR calc Af Amer: 54 mL/min — ABNORMAL LOW (ref >90)
GFR calc non Af Amer: 46 mL/min — ABNORMAL LOW (ref >90)
Glucose, Bld: 158 mg/dL — ABNORMAL HIGH (ref 70–99)
Potassium: 4.7 mEq/L (ref 3.5–5.1)
Sodium: 136 mEq/L (ref 135–145)
Total Bilirubin: 1.2 mg/dL (ref 0.3–1.2)

## 2013-09-17 LAB — POCT I-STAT TROPONIN I: Troponin i, poc: 0.01 ng/mL (ref 0.00–0.08)

## 2013-09-17 LAB — URINE MICROSCOPIC-ADD ON

## 2013-09-17 MED ORDER — ONDANSETRON 4 MG PO TBDP: 4.0000 mg | ORAL_TABLET | Freq: Three times a day (TID) | ORAL | Status: DC | PRN

## 2013-09-17 NOTE — ED Notes (Signed)
Pt's daughter states she will wait for the pt at pt's resident. Pt's daughter made this RN aware that pt has 24/7 home care.

## 2013-09-17 NOTE — ED Notes (Signed)
Pt presents to ED via EMS with c/o yellowish productive cough, nausea and vomiting. Pt discharge in patient on Sunday for bronchitis. Pt send by Dr. Leonette Most Record. Per EMS, BP-184/108, SpO2-94% room air. EMS given 4mg  Zofran at 1635. Pt states nausea decreased.

## 2013-09-17 NOTE — ED Provider Notes (Signed)
CSN: [id number]     Arrival date & time 09/17/13  1707 History   First MD Initiated Contact with Patient 09/17/13 1709     Chief Complaint  Patient presents with  . Cough   (Consider location/radiation/quality/duration/timing/severity/associated sxs/prior Treatment) HPI Patient presents with concerns of cough, nausea, vomiting. Symptoms began today. Patient was recently discharged from this facility after an episode of bronchitis. The patient states that in the intraoral no, she was generally well. No clear precipitant today. No relief with anything, and the patient specifically states that there is no new pain.  There is mild associated dyspnea with coughing. She denies fever, abdominal pain specifically.  Past Medical History  Diagnosis Date  . Hypertension   . Vertigo     "it's gotten better" (03/02/2013)  . CAD (coronary artery disease) 2000    s/p CABG   . Hyperlipidemia   . Scoliosis   . Vertebral artery stenosis 2006    75-90% per MR/MRA in 2006   . Pulmonary embolism 03/02/2013    "bilaterally; small" (03/02/2013)  . Exertional shortness of breath     "recently" (03/02/2013)  . Daily headache     "sometimes" (03/02/2013)  . GERD (gastroesophageal reflux disease)     "used to be bad; not so anymore" (03/02/2013)  . Arthritis     "in her back" (03/02/2013)  . TIA (transient ischemic attack) 07/2011  . Chronic lower back pain   . Anxiety   . Parkinson disease     "dx'd just 2 wk ago" (09/07/2013)   Past Surgical History  Procedure Laterality Date  . Aortic valve replacement  2000    "pig valve" (03/02/2013)  . Cholecystectomy  2000  . Vaginal hysterectomy  ~ 1987  . Coronary artery bypass graft  2000    "CABG X?" (03/02/2013)  . Cataract extraction w/ intraocular lens  implant, bilateral Bilateral 2005  . Cardiac valve replacement    . Coronary angioplasty  1989   History reviewed. No pertinent family history. History  Substance Use Topics  . Smoking status:  Never Smoker   . Smokeless tobacco: Never Used  . Alcohol Use: No   OB History   Grav Para Term Preterm Abortions TAB SAB Ect Mult Living                 Review of Systems  Constitutional:       Per HPI, otherwise negative  HENT:       Per HPI, otherwise negative  Respiratory:       Per HPI, otherwise negative  Cardiovascular:       Per HPI, otherwise negative  Gastrointestinal: Positive for nausea and vomiting. Negative for diarrhea.  Endocrine:       Negative aside from HPI  Genitourinary:       Neg aside from HPI   Musculoskeletal:       Per HPI, otherwise negative  Skin: Negative.   Neurological: Negative for syncope.    Allergies  Review of patient's allergies indicates no known allergies.  Home Medications   Current Outpatient Rx  Name  Route  Sig  Dispense  Refill  . acetaminophen (TYLENOL) 500 MG tablet   Oral   Take 1,000 mg by mouth every 6 (six) hours as needed for moderate pain or headache.         Marland Kitchen atorvastatin (LIPITOR) 40 MG tablet   Oral   Take 40 mg by mouth daily.           Marland Kitchen  citalopram (CELEXA) 10 MG tablet   Oral   Take 10 mg by mouth daily.         . diazepam (VALIUM) 5 MG tablet   Oral   Take 2.5 mg by mouth 2 (two) times daily. Take 1/2 tablet every morning and 1/2 tablet every evening at 5 pm.         . felodipine (PLENDIL) 5 MG 24 hr tablet   Oral   Take 5 mg by mouth daily.           Marland Kitchen gabapentin (NEURONTIN) 300 MG capsule   Oral   Take 300 mg by mouth 2 (two) times daily. Take one capsule in the morning and 1 capsule at bedtime.         Marland Kitchen HYDROcodone-acetaminophen (NORCO) 7.5-325 MG per tablet   Oral   Take 1 tablet by mouth every 8 (eight) hours as needed. For pain   30 tablet   0   . levofloxacin (LEVAQUIN) 750 MG tablet   Oral   Take 1 tablet (750 mg total) by mouth every other day. Please take one more dose on 09/11/2013.   1 tablet   0   . lisinopril (PRINIVIL,ZESTRIL) 10 MG tablet   Oral   Take 1  tablet (10 mg total) by mouth daily.   30 tablet   0   . rOPINIRole (REQUIP) 0.25 MG tablet   Oral   Take 0.125-0.25 mg by mouth 2 (two) times daily. Take 1/2 tablet every morning and 1 whole tablet in the evening at 5 pm.         . warfarin (COUMADIN) 2.5 MG tablet   Oral   Take 2.5 mg by mouth daily. Take 2.5mg  on mon, wed and fri. Take 1.25mg  the rest of the week          BP 179/70  Pulse 88  Temp(Src) 98.3 F (36.8 C) (Oral)  Resp 17  SpO2 93% Physical Exam  Nursing note and vitals reviewed. Constitutional: She is oriented to person, place, and time. She appears well-developed and well-nourished.  Uncomfortable appearing elderly female with an emesis bag in place  HENT:  Head: Normocephalic and atraumatic.  Eyes: Conjunctivae and EOM are normal.  Cardiovascular: Normal rate, regular rhythm and intact distal pulses.   Murmur heard. Pulmonary/Chest: Effort normal and breath sounds normal. No stridor. No respiratory distress.  Abdominal: She exhibits no distension. There is no tenderness. There is no rebound.  Musculoskeletal:  No gross deformity  Neurological: She is alert and oriented to person, place, and time. No cranial nerve deficit. She exhibits normal muscle tone. Coordination normal.  Skin: Skin is warm and dry.  Psychiatric: She has a normal mood and affect.    ED Course  Procedures (including critical care time) Labs Review Labs Reviewed - No data to display Imaging Review No results found.  EKG Interpretation   None      After the initial evaluation I reviewed the patient's chart, including recent discharge summary. Pulse oximetry 97% room air normal   9:11 PM On repeat exam the patient appears generally well.  She states this feels substantially better, she has no ongoing pain, or any dyspnea. I discussed all findings with her and her grand daughter, who is being arranged the home care for this patient.  Conversation on the merits of  admission versus returning home to 24-hour care, with careful monitoring.  MDM   1. Cough    This patient presents with  his recent hospitalization, now with concern for cough, posttussive emesis.  On exam she is awake and alert, and she improved substantially while here following provision of antiemetic.  There is currently no evidence of pneumonia, nor systemic infection.  Patient's vital signs are stable throughout her emergency department course.  I had a lengthy conversation with her and her grandmother daughter regarding all findings.  Absence of distress, with her improvement, with stable vital signs, she was appropriate for discharge with close outpatient followup.  Patient has 24-hour home health assistance    Gerhard Munch, MD 09/18/13 0000

## 2013-09-17 NOTE — ED Notes (Signed)
Pt waiting for PTAR 

## 2013-09-19 LAB — URINE CULTURE

## 2013-10-11 ENCOUNTER — Encounter (HOSPITAL_COMMUNITY): Payer: Self-pay | Admitting: Emergency Medicine

## 2013-10-11 ENCOUNTER — Emergency Department (HOSPITAL_COMMUNITY): Payer: Medicare Other

## 2013-10-11 ENCOUNTER — Observation Stay (HOSPITAL_COMMUNITY)
Admission: EM | Admit: 2013-10-11 | Discharge: 2013-10-13 | Disposition: A | Discharge status: Home / Self Care | Payer: Medicare Other | Attending: Internal Medicine | Admitting: Internal Medicine

## 2013-10-11 DIAGNOSIS — G20A1 Parkinson's disease without dyskinesia, without mention of fluctuations: Secondary | ICD-10-CM | POA: Insufficient documentation

## 2013-10-11 DIAGNOSIS — M129 Arthropathy, unspecified: Secondary | ICD-10-CM | POA: Insufficient documentation

## 2013-10-11 DIAGNOSIS — Z9889 Other specified postprocedural states: Secondary | ICD-10-CM

## 2013-10-11 DIAGNOSIS — Z79899 Other long term (current) drug therapy: Secondary | ICD-10-CM | POA: Insufficient documentation

## 2013-10-11 DIAGNOSIS — M4120 Other idiopathic scoliosis, site unspecified: Secondary | ICD-10-CM | POA: Insufficient documentation

## 2013-10-11 DIAGNOSIS — R519 Headache, unspecified: Secondary | ICD-10-CM | POA: Diagnosis present

## 2013-10-11 DIAGNOSIS — F411 Generalized anxiety disorder: Secondary | ICD-10-CM | POA: Insufficient documentation

## 2013-10-11 DIAGNOSIS — Z8673 Personal history of transient ischemic attack (TIA), and cerebral infarction without residual deficits: Secondary | ICD-10-CM | POA: Insufficient documentation

## 2013-10-11 DIAGNOSIS — N183 Chronic kidney disease, stage 3 unspecified: Secondary | ICD-10-CM | POA: Insufficient documentation

## 2013-10-11 DIAGNOSIS — R51 Headache: Principal | ICD-10-CM | POA: Insufficient documentation

## 2013-10-11 DIAGNOSIS — K219 Gastro-esophageal reflux disease without esophagitis: Secondary | ICD-10-CM | POA: Insufficient documentation

## 2013-10-11 DIAGNOSIS — I6509 Occlusion and stenosis of unspecified vertebral artery: Secondary | ICD-10-CM | POA: Insufficient documentation

## 2013-10-11 DIAGNOSIS — Z7982 Long term (current) use of aspirin: Secondary | ICD-10-CM | POA: Insufficient documentation

## 2013-10-11 DIAGNOSIS — I10 Essential (primary) hypertension: Secondary | ICD-10-CM | POA: Diagnosis present

## 2013-10-11 DIAGNOSIS — E785 Hyperlipidemia, unspecified: Secondary | ICD-10-CM | POA: Diagnosis present

## 2013-10-11 DIAGNOSIS — G20 Parkinson's disease: Secondary | ICD-10-CM | POA: Insufficient documentation

## 2013-10-11 DIAGNOSIS — Z951 Presence of aortocoronary bypass graft: Secondary | ICD-10-CM

## 2013-10-11 DIAGNOSIS — R112 Nausea with vomiting, unspecified: Secondary | ICD-10-CM | POA: Diagnosis present

## 2013-10-11 DIAGNOSIS — Z86711 Personal history of pulmonary embolism: Secondary | ICD-10-CM | POA: Insufficient documentation

## 2013-10-11 DIAGNOSIS — I129 Hypertensive chronic kidney disease with stage 1 through stage 4 chronic kidney disease, or unspecified chronic kidney disease: Secondary | ICD-10-CM | POA: Insufficient documentation

## 2013-10-11 DIAGNOSIS — I2510 Atherosclerotic heart disease of native coronary artery without angina pectoris: Secondary | ICD-10-CM | POA: Insufficient documentation

## 2013-10-11 DIAGNOSIS — G8929 Other chronic pain: Secondary | ICD-10-CM | POA: Insufficient documentation

## 2013-10-11 DIAGNOSIS — R42 Dizziness and giddiness: Secondary | ICD-10-CM | POA: Diagnosis present

## 2013-10-11 LAB — CBC WITH DIFFERENTIAL/PLATELET
Basophils Absolute: 0 10*3/uL (ref 0.0–0.1)
Basophils Relative: 0 % (ref 0–1)
HCT: 43.3 % (ref 36.0–46.0)
Hemoglobin: 14.9 g/dL (ref 12.0–15.0)
Lymphocytes Relative: 11 % — ABNORMAL LOW (ref 12–46)
Lymphs Abs: 0.8 10*3/uL (ref 0.7–4.0)
MCH: 31.6 pg (ref 26.0–34.0)
MCHC: 34.4 g/dL (ref 30.0–36.0)
Monocytes Absolute: 0.3 10*3/uL (ref 0.1–1.0)
Monocytes Relative: 4 % (ref 3–12)
Neutro Abs: 6.1 10*3/uL (ref 1.7–7.7)
Platelets: 203 10*3/uL (ref 150–400)
RBC: 4.71 MIL/uL (ref 3.87–5.11)

## 2013-10-11 LAB — COMPREHENSIVE METABOLIC PANEL
AST: 23 U/L (ref 0–37)
Albumin: 4 g/dL (ref 3.5–5.2)
Alkaline Phosphatase: 106 U/L (ref 39–117)
BUN: 13 mg/dL (ref 6–23)
CO2: 25 mEq/L (ref 19–32)
Chloride: 99 mEq/L (ref 96–112)
Creatinine, Ser: 0.85 mg/dL (ref 0.50–1.10)
GFR calc Af Amer: 70 mL/min — ABNORMAL LOW (ref >90)
GFR calc non Af Amer: 60 mL/min — ABNORMAL LOW (ref >90)
Glucose, Bld: 160 mg/dL — ABNORMAL HIGH (ref 70–99)
Potassium: 3.7 mEq/L (ref 3.5–5.1)
Total Bilirubin: 1.8 mg/dL — ABNORMAL HIGH (ref 0.3–1.2)

## 2013-10-11 LAB — GLUCOSE, CAPILLARY: Glucose-Capillary: 206 mg/dL — ABNORMAL HIGH (ref 70–99)

## 2013-10-11 MED ORDER — ONDANSETRON HCL 4 MG/2ML IJ SOLN: 4.0000 mg | Freq: Once | INTRAMUSCULAR | Status: DC

## 2013-10-11 MED ORDER — SODIUM CHLORIDE 0.9 % IV SOLN
INTRAVENOUS | Status: DC
  Administered 2013-10-11: 23:00:00 via INTRAVENOUS

## 2013-10-11 MED ORDER — MORPHINE SULFATE 2 MG/ML IJ SOLN
2.0000 mg | INTRAMUSCULAR | Status: DC | PRN
  Administered 2013-10-11: 2 mg via INTRAVENOUS
  Filled 2013-10-11: qty 1

## 2013-10-11 MED ORDER — ONDANSETRON HCL 4 MG/2ML IJ SOLN
4.0000 mg | Freq: Once | INTRAMUSCULAR | Status: AC
  Administered 2013-10-11: 4 mg via INTRAVENOUS
  Filled 2013-10-11: qty 2

## 2013-10-11 NOTE — ED Notes (Signed)
[id number]. Notified RN

## 2013-10-11 NOTE — ED Notes (Signed)
Dr. Opitz at bedside. 

## 2013-10-11 NOTE — ED Provider Notes (Signed)
CSN: [id number]     Arrival date & time 10/11/13  2204 History   First MD Initiated Contact with Patient 10/11/13 2251     Chief Complaint  Patient presents with  . Headache  . Nausea   (Consider location/radiation/quality/duration/timing/severity/associated sxs/prior Treatment) HPI Hx per PT - Has PD and recent PE stopped coumadin a few weeks ago.  She gets regular Headaches but they usually go away and are not severe. Today woke up with 7/10 HA and dizziness/ vertigo worse with certain positions like sitting up. She has associated nausea and tonight HA worsening with ringing in her ears and multiple episodes of emesis. Unable to walk due to vertigo. She normally uses a walker, no fall or trauma. No unilateral weakness or numbness, no diff with speech  Past Medical History  Diagnosis Date  . Hypertension   . Vertigo     "it's gotten better" (03/02/2013)  . CAD (coronary artery disease) 2000    s/p CABG   . Hyperlipidemia   . Scoliosis   . Vertebral artery stenosis 2006    75-90% per MR/MRA in 2006   . Pulmonary embolism 03/02/2013    "bilaterally; small" (03/02/2013)  . Exertional shortness of breath     "recently" (03/02/2013)  . Daily headache     "sometimes" (03/02/2013)  . GERD (gastroesophageal reflux disease)     "used to be bad; not so anymore" (03/02/2013)  . Arthritis     "in her back" (03/02/2013)  . TIA (transient ischemic attack) 07/2011  . Chronic lower back pain   . Anxiety   . Parkinson disease     "dx'd just 2 wk ago" (09/07/2013)   Past Surgical History  Procedure Laterality Date  . Aortic valve replacement  2000    "pig valve" (03/02/2013)  . Cholecystectomy  2000  . Vaginal hysterectomy  ~ 1987  . Coronary artery bypass graft  2000    "CABG X?" (03/02/2013)  . Cataract extraction w/ intraocular lens  implant, bilateral Bilateral 2005  . Cardiac valve replacement    . Coronary angioplasty  1989   No family history on file. History  Substance Use Topics  .  Smoking status: Never Smoker   . Smokeless tobacco: Never Used  . Alcohol Use: No   OB History   Grav Para Term Preterm Abortions TAB SAB Ect Mult Living                 Review of Systems  Constitutional: Negative for fever and chills.  Eyes: Negative for pain.  Respiratory: Negative for shortness of breath.   Cardiovascular: Negative for chest pain.  Gastrointestinal: Positive for vomiting. Negative for abdominal pain.  Genitourinary: Negative for dysuria.  Musculoskeletal: Negative for back pain, neck pain and neck stiffness.  Skin: Negative for rash.  Neurological: Positive for dizziness and headaches.  All other systems reviewed and are negative.    Allergies  Review of patient's allergies indicates no known allergies.  Home Medications   Current Outpatient Rx  Name  Route  Sig  Dispense  Refill  . aspirin EC 81 MG tablet   Oral   Take 81 mg by mouth daily.         Marland Kitchen atorvastatin (LIPITOR) 40 MG tablet   Oral   Take 40 mg by mouth daily.           . citalopram (CELEXA) 10 MG tablet   Oral   Take 10 mg by mouth daily.         Marland Kitchen  diazepam (VALIUM) 5 MG tablet   Oral   Take 2.5 mg by mouth 2 (two) times daily. Take 1/2 tablet every morning and 1/2 tablet every evening at 5 pm.         . felodipine (PLENDIL) 5 MG 24 hr tablet   Oral   Take 5 mg by mouth daily.           Marland Kitchen gabapentin (NEURONTIN) 300 MG capsule   Oral   Take 300 mg by mouth 2 (two) times daily. Take one capsule in the morning and 1 capsule at bedtime.         Marland Kitchen HYDROcodone-acetaminophen (NORCO) 7.5-325 MG per tablet   Oral   Take 1 tablet by mouth every 6 (six) hours as needed for moderate pain.         Marland Kitchen lisinopril (PRINIVIL,ZESTRIL) 10 MG tablet   Oral   Take 10 mg by mouth daily.         . meclizine (ANTIVERT) 25 MG tablet   Oral   Take 25 mg by mouth 3 (three) times daily as needed for dizziness.         . ondansetron (ZOFRAN ODT) 4 MG disintegrating tablet    Oral   Take 1 tablet (4 mg total) by mouth every 8 (eight) hours as needed for nausea or vomiting.   20 tablet   0   . rOPINIRole (REQUIP) 0.25 MG tablet   Oral   Take 0.125-0.25 mg by mouth 2 (two) times daily. Take 1/2 tablet every morning and 1 whole tablet in the evening at 5 pm.          BP 194/78  Pulse 88  Temp(Src) 98.4 F (36.9 C) (Oral)  Resp 20  Ht 4\' 11"  (1.499 m)  Wt 144 lb (65.318 kg)  BMI 29.07 kg/m2  SpO2 95% Physical Exam  Constitutional: She is oriented to person, place, and time. She appears well-developed and well-nourished.  HENT:  Head: Normocephalic and atraumatic.  Mouth/Throat: Oropharynx is clear and moist.  Eyes: EOM are normal. Pupils are equal, round, and reactive to light.  Neck: Neck supple.  Cardiovascular: Normal rate, regular rhythm and intact distal pulses.   Pulmonary/Chest: Effort normal and breath sounds normal. No respiratory distress.  Abdominal: Soft. She exhibits no distension.  Musculoskeletal: Normal range of motion. She exhibits no tenderness.  UE tremor c/w parkinson, no pronator drift, equal grips/ biceps/ triceops  Neurological: She is alert and oriented to person, place, and time. No cranial nerve deficit.  Speech clear, lateral nystagmus with rightward gaze does not reproduce vertigo  Skin: Skin is warm and dry.    ED Course  Procedures (including critical care time) Labs Review Labs Reviewed  CBC WITH DIFFERENTIAL - Abnormal; Notable for the following:    Neutrophils Relative % 85 (*)    Lymphocytes Relative 11 (*)    All other components within normal limits  COMPREHENSIVE METABOLIC PANEL - Abnormal; Notable for the following:    Glucose, Bld 160 (*)    Total Bilirubin 1.8 (*)    GFR calc non Af Amer 60 (*)    GFR calc Af Amer 70 (*)    All other components within normal limits  GLUCOSE, CAPILLARY - Abnormal; Notable for the following:    Glucose-Capillary 206 (*)    All other components within normal limits   PROTIME-INR  URINALYSIS, ROUTINE W REFLEX MICROSCOPIC   Imaging Review Ct Head Wo Contrast  10/12/2013   CLINICAL DATA:  Right-sided headache.  Nausea and vomiting.  EXAM: CT HEAD WITHOUT CONTRAST  TECHNIQUE: Contiguous axial images were obtained from the base of the skull through the vertex without intravenous contrast.  COMPARISON:  CT of the head and MRI of the brain performed 09/06/2011  FINDINGS: There is no evidence of acute infarction, mass lesion, or intra- or extra-axial hemorrhage on CT.  Prominence of the ventricles and sulci reflects mild to moderate cortical volume loss. Mild cerebellar atrophy is noted. Scattered periventricular and subcortical white matter change likely reflects small vessel ischemic microangiopathy.  The posterior fossa, including the cerebellum, brainstem and fourth ventricle, is within normal limits. The third and lateral ventricles, and basal ganglia are unremarkable in appearance. The cerebral hemispheres are symmetric in appearance, with normal gray-white differentiation. No mass effect or midline shift is seen.  There is no evidence of fracture; visualized osseous structures are unremarkable in appearance. The orbits are within normal limits. The paranasal sinuses and mastoid air cells are well-aerated. No significant soft tissue abnormalities are seen.  IMPRESSION: 1. No acute intracranial pathology seen on CT. 2. Mild to moderate cortical volume loss and scattered small vessel ischemic microangiopathy.   Electronically Signed   By: Roanna Raider M.D.   On: 10/12/2013 00:20   Dg Chest Portable 1 View  10/11/2013   CLINICAL DATA:  Headache, nausea.  EXAM: PORTABLE CHEST - 1 VIEW  COMPARISON:  09/17/2013  FINDINGS: The patient has had median sternotomy and CABG. The heart is enlarged. The left costophrenic angle is excluded. There is no evidence for pulmonary edema. No focal consolidations or definite pleural effusions are identified.  IMPRESSION: Cardiomegaly.    Electronically Signed   By: Rosalie Gums M.D.   On: 10/11/2013 23:45    EKG Interpretation    Date/Time:  Monday October 11 2013 22:14:50 EST Ventricular Rate:  82 PR Interval:    QRS Duration: 87 QT Interval:  448 QTC Calculation: 523 R Axis:   101 Text Interpretation:  Sinus rhythm Right axis deviation Nonspecific ST and T wave abnormality Prolonged QT interval Confirmed by Sharrod Achille  MD, Nuchem Grattan [phone]) on 10/11/2013 11:06:48 PM           IVFs, IV zofran  On recheck remains symptomatic, and does not feel like she can ambulate due to symptoms. She has vomited in emergency department. IV fluids continued and medicine consult for admission.  MDM  Diagnosis: Vertigo, headache, persistent vomiting  Evaluated with EKG, labs, chest x-ray and CT scan reviewed as above. Treated with IV fluids and antiemetics and and 2 mg IV morphine for headache. MED admit  Sunnie Nielsen, MD 10/12/13 [phone]

## 2013-10-11 NOTE — ED Notes (Signed)
Phlebotomy at bedside.

## 2013-10-11 NOTE — ED Notes (Signed)
PER EMS: pt from home, hx of parkinsons. Pt reports ride sided HA that started this morning when she woke up at 0530. Pt also reports Nausea and vomiting x4 that started around noon today. Pts family reports her parkinsons tremors have increased today as well. Pt A&Ox4, no other neuro deficits noted at this time. VS PTA: BP-200/90, HR-80s, O2-100% RA, RR-22. 20g IV LAC.

## 2013-10-12 ENCOUNTER — Encounter (HOSPITAL_COMMUNITY): Payer: Self-pay | Admitting: Radiology

## 2013-10-12 ENCOUNTER — Emergency Department (HOSPITAL_COMMUNITY): Payer: Medicare Other

## 2013-10-12 DIAGNOSIS — I2510 Atherosclerotic heart disease of native coronary artery without angina pectoris: Secondary | ICD-10-CM

## 2013-10-12 DIAGNOSIS — R112 Nausea with vomiting, unspecified: Secondary | ICD-10-CM

## 2013-10-12 DIAGNOSIS — R42 Dizziness and giddiness: Secondary | ICD-10-CM

## 2013-10-12 DIAGNOSIS — R51 Headache: Principal | ICD-10-CM

## 2013-10-12 DIAGNOSIS — R519 Headache, unspecified: Secondary | ICD-10-CM | POA: Diagnosis present

## 2013-10-12 DIAGNOSIS — E785 Hyperlipidemia, unspecified: Secondary | ICD-10-CM

## 2013-10-12 DIAGNOSIS — I10 Essential (primary) hypertension: Secondary | ICD-10-CM

## 2013-10-12 LAB — COMPREHENSIVE METABOLIC PANEL
ALT: 13 U/L (ref 0–35)
BUN: 12 mg/dL (ref 6–23)
CO2: 27 mEq/L (ref 19–32)
Calcium: 8.4 mg/dL (ref 8.4–10.5)
Creatinine, Ser: 0.91 mg/dL (ref 0.50–1.10)
GFR calc Af Amer: 64 mL/min — ABNORMAL LOW (ref >90)
GFR calc non Af Amer: 56 mL/min — ABNORMAL LOW (ref >90)
Glucose, Bld: 106 mg/dL — ABNORMAL HIGH (ref 70–99)
Potassium: 4.1 mEq/L (ref 3.5–5.1)
Sodium: 138 mEq/L (ref 135–145)
Total Protein: 6.1 g/dL (ref 6.0–8.3)

## 2013-10-12 LAB — CBC
HCT: 39.7 % (ref 36.0–46.0)
Hemoglobin: 13.6 g/dL (ref 12.0–15.0)
MCHC: 34.3 g/dL (ref 30.0–36.0)
Platelets: 208 10*3/uL (ref 150–400)
RBC: 4.29 MIL/uL (ref 3.87–5.11)
WBC: 6.2 10*3/uL (ref 4.0–10.5)

## 2013-10-12 LAB — TSH: TSH: 0.515 u[IU]/mL (ref 0.350–4.500)

## 2013-10-12 LAB — URINE MICROSCOPIC-ADD ON

## 2013-10-12 LAB — URINALYSIS, ROUTINE W REFLEX MICROSCOPIC
Bilirubin Urine: NEGATIVE
Glucose, UA: NEGATIVE mg/dL
Ketones, ur: NEGATIVE mg/dL
Leukocytes, UA: NEGATIVE
Specific Gravity, Urine: 1.011 (ref 1.005–1.030)
pH: 6 (ref 5.0–8.0)

## 2013-10-12 LAB — PROTIME-INR: Prothrombin Time: 12.5 seconds (ref 11.6–15.2)

## 2013-10-12 LAB — HEMOGLOBIN A1C
Hgb A1c MFr Bld: 5.9 % — ABNORMAL HIGH (ref <5.7)
Mean Plasma Glucose: 123 mg/dL — ABNORMAL HIGH (ref <117)

## 2013-10-12 LAB — VITAMIN B12: Vitamin B-12: 308 pg/mL (ref 211–911)

## 2013-10-12 MED ORDER — ONDANSETRON HCL 4 MG/2ML IJ SOLN: 4.0000 mg | Freq: Four times a day (QID) | INTRAMUSCULAR | Status: DC | PRN

## 2013-10-12 MED ORDER — CITALOPRAM HYDROBROMIDE 10 MG PO TABS
10.0000 mg | ORAL_TABLET | Freq: Every day | ORAL | Status: DC
  Administered 2013-10-12 – 2013-10-13 (×2): 10 mg via ORAL
  Filled 2013-10-12 (×2): qty 1

## 2013-10-12 MED ORDER — FELODIPINE ER 5 MG PO TB24
5.0000 mg | ORAL_TABLET | Freq: Every day | ORAL | Status: DC
Start: 2013-10-12 — End: 2013-10-13
  Administered 2013-10-12 – 2013-10-13 (×2): 5 mg via ORAL
  Filled 2013-10-12 (×2): qty 1

## 2013-10-12 MED ORDER — HYDROCODONE-ACETAMINOPHEN 7.5-325 MG PO TABS: 1.0000 | ORAL_TABLET | Freq: Four times a day (QID) | ORAL | Status: DC | PRN

## 2013-10-12 MED ORDER — LISINOPRIL 10 MG PO TABS
10.0000 mg | ORAL_TABLET | Freq: Every day | ORAL | Status: DC
Start: 2013-10-12 — End: 2013-10-13
  Administered 2013-10-12 – 2013-10-13 (×2): 10 mg via ORAL
  Filled 2013-10-12 (×2): qty 1

## 2013-10-12 MED ORDER — ROPINIROLE HCL 0.25 MG PO TABS
0.2500 mg | ORAL_TABLET | Freq: Every day | ORAL | Status: DC
  Administered 2013-10-12: 0.25 mg via ORAL
  Filled 2013-10-12 (×2): qty 1

## 2013-10-12 MED ORDER — MECLIZINE HCL 25 MG PO TABS
25.0000 mg | ORAL_TABLET | Freq: Three times a day (TID) | ORAL | Status: DC | PRN
  Administered 2013-10-12: 25 mg via ORAL
  Filled 2013-10-12: qty 1

## 2013-10-12 MED ORDER — ROPINIROLE HCL 0.25 MG PO TABS
0.0125 mg | ORAL_TABLET | Freq: Every day | ORAL | Status: DC
  Administered 2013-10-12: 0.125 mg via ORAL
  Filled 2013-10-12 (×3): qty 0.5

## 2013-10-12 MED ORDER — MECLIZINE HCL 12.5 MG PO TABS
12.5000 mg | ORAL_TABLET | Freq: Three times a day (TID) | ORAL | Status: DC
  Administered 2013-10-12 – 2013-10-13 (×3): 12.5 mg via ORAL
  Filled 2013-10-12 (×6): qty 1

## 2013-10-12 MED ORDER — ASPIRIN EC 81 MG PO TBEC
81.0000 mg | DELAYED_RELEASE_TABLET | Freq: Every day | ORAL | Status: DC
  Administered 2013-10-12 – 2013-10-13 (×2): 81 mg via ORAL
  Filled 2013-10-12 (×2): qty 1

## 2013-10-12 MED ORDER — DIAZEPAM 5 MG PO TABS
2.5000 mg | ORAL_TABLET | Freq: Two times a day (BID) | ORAL | Status: DC
  Administered 2013-10-12 – 2013-10-13 (×3): 2.5 mg via ORAL
  Filled 2013-10-12 (×3): qty 1

## 2013-10-12 MED ORDER — ONDANSETRON HCL 4 MG PO TABS: 4.0000 mg | ORAL_TABLET | Freq: Four times a day (QID) | ORAL | Status: DC | PRN

## 2013-10-12 MED ORDER — SODIUM CHLORIDE 0.9 % IV SOLN
INTRAVENOUS | Status: DC
  Administered 2013-10-12: 03:00:00 via INTRAVENOUS

## 2013-10-12 MED ORDER — ATORVASTATIN CALCIUM 40 MG PO TABS
40.0000 mg | ORAL_TABLET | Freq: Every day | ORAL | Status: DC
  Administered 2013-10-12 – 2013-10-13 (×2): 40 mg via ORAL
  Filled 2013-10-12 (×2): qty 1

## 2013-10-12 MED ORDER — MECLIZINE HCL 25 MG PO TABS
25.0000 mg | ORAL_TABLET | Freq: Three times a day (TID) | ORAL | Status: DC
  Filled 2013-10-12 (×3): qty 1

## 2013-10-12 MED ORDER — ACETAMINOPHEN 650 MG RE SUPP: 650.0000 mg | Freq: Four times a day (QID) | RECTAL | Status: DC | PRN

## 2013-10-12 MED ORDER — GABAPENTIN 300 MG PO CAPS
300.0000 mg | ORAL_CAPSULE | Freq: Two times a day (BID) | ORAL | Status: DC
  Administered 2013-10-12 – 2013-10-13 (×3): 300 mg via ORAL
  Filled 2013-10-12 (×6): qty 1

## 2013-10-12 MED ORDER — ONDANSETRON 4 MG PO TBDP
4.0000 mg | ORAL_TABLET | Freq: Three times a day (TID) | ORAL | Status: DC | PRN
  Filled 2013-10-12: qty 1

## 2013-10-12 MED ORDER — MORPHINE SULFATE 2 MG/ML IJ SOLN: 1.0000 mg | INTRAMUSCULAR | Status: DC | PRN

## 2013-10-12 MED ORDER — ACETAMINOPHEN 325 MG PO TABS: 650.0000 mg | ORAL_TABLET | Freq: Four times a day (QID) | ORAL | Status: DC | PRN

## 2013-10-12 MED ORDER — SODIUM CHLORIDE 0.9 % IJ SOLN
3.0000 mL | Freq: Two times a day (BID) | INTRAMUSCULAR | Status: DC
  Administered 2013-10-12 – 2013-10-13 (×2): 3 mL via INTRAVENOUS

## 2013-10-12 NOTE — Evaluation (Signed)
Read, reviewed, and agree.  Rosangelica Pevehouse B. Jennine Peddy, PT, DPT #[phone]  

## 2013-10-12 NOTE — Progress Notes (Signed)
Patient seen and examined, admitted earlier this morning by Dr. Toniann Fail.  Briefly 79 year old female with history of CAD, CHF, Parkinson's, aortic valve repair presented with persistent nausea, vomiting, headache, dizziness possible vertigo.  Examination unremarkable  Vertigo - Follow MRI of the brain, orthostatics vitals - Placed on scheduled meclizine x 48hrs - PTOT vestibular evaluation - Swallow LFTs   Karver Fadden M.D. [facility] 10/12/2013, 1:28 PM  Pager: [phone]

## 2013-10-12 NOTE — Progress Notes (Signed)
Pt. C/o difficulty urinating.  Pt. Bladder scanned with noted.  Night hospitalist notified.  Order received to in and out catheterize patient.  Pt. Catheterized using aseptic technique.  Pt. Tolerated well.  525cc yellow colored urine obtained.

## 2013-10-12 NOTE — Evaluation (Signed)
Physical Therapy Evaluation Patient Details Name: Kayla Ramos MRN: [id number] DOB: 05/14/1925 Today's Date: 10/12/2013 Time: 5284-1324 PT Time Calculation (min): 26 min  PT Assessment / Plan / Recommendation History of Present Illness  Pt admitted with N/V, headache, suspected vertigo starting 12/8. Patient was admitted last month for acute respiratory failure from bronchitis and CHF. PMH of CAD, CHF, Parkinson's, Aortic valve repair, PE. Neg CT scan upon admission.   Clinical Impression  Pt admitted with above.  Patient no longer complains of dizziness or any vestibular symptoms.  Therefore the evaluation went on without extensive vestibular testing after initial tests were negative. Pt currently with functional limitations due to the deficits listed below (see PT Problem List). Patient's function is significantly decreased from her levels prior to admission.  Patient reports that she was walking independently at home before, and currently is total assist x2 for all mobility. Though patient does have home care 24/7, due to her significant functional decline, I recommend that the Pt will benefit from skilled PT to increase their independence and safety with mobility to allow discharge to SNF.         PT Assessment  Patient needs continued PT services    Follow Up Recommendations  SNF;Supervision/Assistance - 24 hour          Equipment Recommendations  None recommended by PT (She already owns a RW)    Recommendations for Other Services     Frequency Min 3X/week    Precautions / Restrictions Precautions Precautions: Fall Restrictions Weight Bearing Restrictions: No   Pertinent Vitals/Pain None reported, No dizziness reported      Mobility  Bed Mobility Bed Mobility: Supine to Sit Supine to Sit: 1: +2 Total assist Supine to Sit: Patient Percentage: 20% Details for Bed Mobility Assistance: Required both total trunk assistance and A to move her LE off the bed.  Patient was  barely able to put forth any effort into this task due to weakness. Transfers Transfers: Sit to Stand;Stand to Sit;Stand Pivot Transfers Sit to Stand: 1: +2 Total assist (Needs bilateral assistance at hips, and B arms to pull up) Sit to Stand: Patient Percentage: 20% Stand to Sit: 1: +2 Total assist Stand to Sit: Patient Percentage: 50% Stand Pivot Transfers: 1: +2 Total assist (Assistance at both hips, and both arms to pull herself up) Stand Pivot Transfers: Patient Percentage: 20% Details for Transfer Assistance: Requires total assist throughout entire motion.  One standing, still needs total assist to help with weight shifts of small steps to get to chair. Ambulation/Gait Ambulation/Gait Assistance: Not tested (comment)    Exercises     PT Diagnosis: Difficulty walking;Generalized weakness  PT Problem List: Decreased strength;Decreased activity tolerance;Decreased balance;Decreased coordination;Decreased mobility PT Treatment Interventions: DME instruction;Therapeutic activities;Therapeutic exercise;Neuromuscular re-education;Balance training;Gait training;Functional mobility training;Patient/family education     PT Goals(Current goals can be found in the care plan section) Acute Rehab PT Goals Patient Stated Goal: get better PT Goal Formulation: With patient Time For Goal Achievement: 10/26/13 Potential to Achieve Goals: Good  Visit Information  Last PT Received On: 10/12/13 Assistance Needed: +2 History of Present Illness: Pt admitted with N/V, headache, suspected vertigo starting 12/8. Patient was admitted last month for acute respiratory failure from bronchitis and CHF. PMH of CAD, CHF, Parkinson's, Aortic valve repair, PE       Prior Functioning  Home Living Family/patient expects to be discharged to:: Private residence Living Arrangements: Spouse/significant other Available Help at Discharge: Available 24 hours/day;Family;Personal care attendant Type of Home:  House Home Access: Stairs to enter [employer] of Steps: 5-6 Entrance Stairs-Rails: Can reach both Home Layout: One level Home Equipment: Bedside commode;Grab bars - tub/shower;Toilet riser;Walker - 4 wheels;Shower seat;Hospital bed;Wheelchair - manual Prior Function Level of Independence: Needs assistance Gait / Transfers Assistance Needed: Ambulated to bathroom and back with RW but always had attendance, help to stand up off commode and get OOB ADL's / Homemaking Assistance Needed: mod assist with bathing, total assist with dresssing. Communication Communication: No difficulties Dominant Hand: Right    Cognition  Cognition Arousal/Alertness: Lethargic Behavior During Therapy: WFL for tasks assessed/performed Overall Cognitive Status: Within Functional Limits for tasks assessed    Extremity/Trunk Assessment Upper Extremity Assessment Upper Extremity Assessment: Defer to OT evaluation   Balance    End of Session PT - End of Session Equipment Utilized During Treatment: Gait belt Activity Tolerance: Patient limited by fatigue;Patient limited by lethargy Patient left: in chair;with call bell/phone within reach Nurse Communication: Mobility status  GP    Barrie Dunker, SPT Pager:  [phone]  Barrie Dunker 10/12/2013, 4:32 PM

## 2013-10-12 NOTE — Progress Notes (Signed)
Utilization review completed. Chrys Landgrebe, RN, BSN. 

## 2013-10-12 NOTE — H&P (Addendum)
[facility] History and Physical  Kayla Ramos WUJ:[id number] DOB: 1924/12/30 DOA: 10/11/2013  Referring physician: ER physician. PCP: RECORD,CHARLES   Chief Complaint: Headache nausea vomiting and dizziness.  HPI: Kayla Ramos is a 79 y.o. female with history of CAD status post CABG, CHF, Parkinson's disease, PE, aortic valve repair (pig valve) presented to the ER with persistent nausea vomiting headache and dizziness. Patient states that her symptoms started off yesterday morning and had multiple episodes of nausea vomiting with global headache and she feels dizzy whenever she makes a move. Patient denies losing consciousness chest pain palpitations shortness of breath. Patient does not have any abdominal pain diarrhea and on exam patient's abdomen is benign. CT of the head was negative for anything acute. Patient's headache improved with morphine. Patient still has some dizziness on moving. On exam there is no obvious nystagmus. Patient denies any tinnitus or hearing loss. Patient has been admitted for further management. Patient was admitted last month for acute respiratory failure from bronchitis and CHF. Patient states that recently her Coumadin has been discontinued by her PCP. She was taking Coumadin for PE.   Review of Systems: As presented in the history of presenting illness, rest negative.  Past Medical History  Diagnosis Date  . Hypertension   . Vertigo     "it's gotten better" (03/02/2013)  . CAD (coronary artery disease) 2000    s/p CABG   . Hyperlipidemia   . Scoliosis   . Vertebral artery stenosis 2006    75-90% per MR/MRA in 2006   . Pulmonary embolism 03/02/2013    "bilaterally; small" (03/02/2013)  . Exertional shortness of breath     "recently" (03/02/2013)  . Daily headache     "sometimes" (03/02/2013)  . GERD (gastroesophageal reflux disease)     "used to be bad; not so anymore" (03/02/2013)  . Arthritis     "in her back" (03/02/2013)  . TIA (transient  ischemic attack) 07/2011  . Chronic lower back pain   . Anxiety   . Parkinson disease     "dx'd just 2 wk ago" (09/07/2013)   Past Surgical History  Procedure Laterality Date  . Aortic valve replacement  2000    "pig valve" (03/02/2013)  . Cholecystectomy  2000  . Vaginal hysterectomy  ~ 1987  . Coronary artery bypass graft  2000    "CABG X?" (03/02/2013)  . Cataract extraction w/ intraocular lens  implant, bilateral Bilateral 2005  . Cardiac valve replacement    . Coronary angioplasty  1989   Social History:  reports that she has never smoked. She has never used smokeless tobacco. She reports that she does not drink alcohol or use illicit drugs. Where does patient live home. Can patient participate in ADLs? Not sure.  No Known Allergies  Family History: History reviewed. No pertinent family history. yes.   Prior to Admission medications   Medication Sig Start Date End Date Taking? Authorizing Provider  aspirin EC 81 MG tablet Take 81 mg by mouth daily.   Yes Historical Provider, MD  atorvastatin (LIPITOR) 40 MG tablet Take 40 mg by mouth daily.     Yes Historical Provider, MD  citalopram (CELEXA) 10 MG tablet Take 10 mg by mouth daily.    Historical Provider, MD  diazepam (VALIUM) 5 MG tablet Take 2.5 mg by mouth 2 (two) times daily. Take 1/2 tablet every morning and 1/2 tablet every evening at 5 pm.    Historical Provider, MD  felodipine (PLENDIL) 5  MG 24 hr tablet Take 5 mg by mouth daily.      Historical Provider, MD  gabapentin (NEURONTIN) 300 MG capsule Take 300 mg by mouth 2 (two) times daily. Take one capsule in the morning and 1 capsule at bedtime.    Historical Provider, MD  HYDROcodone-acetaminophen (NORCO) 7.5-325 MG per tablet Take 1 tablet by mouth every 6 (six) hours as needed for moderate pain.    Historical Provider, MD  lisinopril (PRINIVIL,ZESTRIL) 10 MG tablet Take 10 mg by mouth daily.    Historical Provider, MD  meclizine (ANTIVERT) 25 MG tablet Take 25 mg by  mouth 3 (three) times daily as needed for dizziness.    Historical Provider, MD  ondansetron (ZOFRAN ODT) 4 MG disintegrating tablet Take 1 tablet (4 mg total) by mouth every 8 (eight) hours as needed for nausea or vomiting. 09/17/13   Gerhard Munch, MD  rOPINIRole (REQUIP) 0.25 MG tablet Take 0.125-0.25 mg by mouth 2 (two) times daily. Take 1/2 tablet every morning and 1 whole tablet in the evening at 5 pm.    Historical Provider, MD    Physical Exam: Filed Vitals:   10/12/13 0122 10/12/13 0130 10/12/13 0148 10/12/13 0223  BP: 156/58 167/59  156/87  Pulse: 67 71  66  Temp:   98.2 F (36.8 C) 98.5 F (36.9 C)  TempSrc:    Oral  Resp: 20 16  18   Height:    4\' 11"  (1.499 m)  Weight:    63.322 kg (139 lb 9.6 oz)  SpO2: 93% 94%  92%     General:  Well-developed and moderately nourished.  Eyes: Anicteric no pallor. No obvious nystagmus.  ENT: No discharge from the ears eyes nose mouth.  Neck: No mass felt. No JVD appreciated.  Cardiovascular: S1-S2 heard.  Respiratory: No rhonchi or crepitations.  Abdomen: Soft nontender bowel sounds present. No guarding or rigidity.  Skin: No rash.  Musculoskeletal: No edema.  Psychiatric: Appears normal.  Neurologic: Alert awake oriented to time place and person. Moves all extremities.  Labs on Admission:  Basic Metabolic Panel:  Recent Labs Lab 10/11/13 2217  NA 137  K 3.7  CL 99  CO2 25  GLUCOSE 160*  BUN 13  CREATININE 0.85  CALCIUM 8.8   Liver Function Tests:  Recent Labs Lab 10/11/13 2217  AST 23  ALT 17  ALKPHOS 106  BILITOT 1.8*  PROT 7.0  ALBUMIN 4.0   No results found for this basename: LIPASE, AMYLASE,  in the last 168 hours No results found for this basename: AMMONIA,  in the last 168 hours CBC:  Recent Labs Lab 10/11/13 2217  WBC 7.2  NEUTROABS 6.1  HGB 14.9  HCT 43.3  MCV 91.9  PLT 203   Cardiac Enzymes: No results found for this basename: CKTOTAL, CKMB, CKMBINDEX, TROPONINI,  in the  last 168 hours  BNP (last 3 results)  Recent Labs  03/02/13 1235 09/07/13 0742 09/17/13 1754  PROBNP 588.1* 1089.0* 675.2*   CBG:  Recent Labs Lab 10/11/13 2242  GLUCAP 206*    Radiological Exams on Admission: Ct Head Wo Contrast  10/12/2013   CLINICAL DATA:  Right-sided headache.  Nausea and vomiting.  EXAM: CT HEAD WITHOUT CONTRAST  TECHNIQUE: Contiguous axial images were obtained from the base of the skull through the vertex without intravenous contrast.  COMPARISON:  CT of the head and MRI of the brain performed 09/06/2011  FINDINGS: There is no evidence of acute infarction, mass lesion, or intra-  or extra-axial hemorrhage on CT.  Prominence of the ventricles and sulci reflects mild to moderate cortical volume loss. Mild cerebellar atrophy is noted. Scattered periventricular and subcortical white matter change likely reflects small vessel ischemic microangiopathy.  The posterior fossa, including the cerebellum, brainstem and fourth ventricle, is within normal limits. The third and lateral ventricles, and basal ganglia are unremarkable in appearance. The cerebral hemispheres are symmetric in appearance, with normal gray-white differentiation. No mass effect or midline shift is seen.  There is no evidence of fracture; visualized osseous structures are unremarkable in appearance. The orbits are within normal limits. The paranasal sinuses and mastoid air cells are well-aerated. No significant soft tissue abnormalities are seen.  IMPRESSION: 1. No acute intracranial pathology seen on CT. 2. Mild to moderate cortical volume loss and scattered small vessel ischemic microangiopathy.   Electronically Signed   By: Roanna Raider M.D.   On: 10/12/2013 00:20   Dg Chest Portable 1 View  10/11/2013   CLINICAL DATA:  Headache, nausea.  EXAM: PORTABLE CHEST - 1 VIEW  COMPARISON:  09/17/2013  FINDINGS: The patient has had median sternotomy and CABG. The heart is enlarged. The left costophrenic angle is  excluded. There is no evidence for pulmonary edema. No focal consolidations or definite pleural effusions are identified.  IMPRESSION: Cardiomegaly.   Electronically Signed   By: Rosalie Gums M.D.   On: 10/11/2013 23:45    EKG: Independently reviewed. Normal sinus rhythm with nonspecific ST-T changes.  Assessment/Plan Principal Problem:   Vertigo Active Problems:   S/P CABG x 3   Hyperlipidemia   HTN (hypertension)   S/P aortic valve repair   Nausea with vomiting   Headache(784.0)   1. Vertigo with nausea vomiting and headache - at this time patient will be observed in telemetry. Symptoms are concerning for benign positional vertigo versus migraines. Patient is nonfocal at this time. MRI brain has been ordered. Get physical therapy. Check orthostatics. Continue when necessary IV morphine for now. Patient's abdomen appears benign and LFTs are normal except for mildly elevated bilirubin. Closely follow LFTs. 2. Hypertension and history of CHF - patient appears compensated. Continue with lisinopril. 3. Hyperglycemia - check hemoglobin A1c. 4. CAD status post CABG - denies any chest pain. 5. History of PE - recently taken off Coumadin. 6. History of aortic valve replacement with pig valve. 7. Hyperlipidemia - continue present medications. 8. History of Parkinson's - continue present medications.    Code Status: Full code.  Family Communication: None.  Disposition Plan: Admit to inpatient.    Delphin Funes N. [facility] Pager [phone].  If 7PM-7AM, please contact night-coverage [email or web address] Password [facility] 10/12/2013, 2:34 AM

## 2013-10-13 ENCOUNTER — Observation Stay (HOSPITAL_COMMUNITY): Payer: Medicare Other

## 2013-10-13 MED ORDER — ROPINIROLE HCL 0.25 MG PO TABS: 0.1250 mg | ORAL_TABLET | Freq: Every day | ORAL | Status: DC

## 2013-10-13 NOTE — Progress Notes (Signed)
Event: Notified by RN pt more lethargic than she has been. Noted more difficult to arouse and somewhat confused at times. Has not received any sedating medications since 10 am today.   Has been awaiting MRI. RN request bedside evaluation. NP to bedside. Subjective: Pt denies any specific c/o. States she wants to sleep.  Objective: Kayla Ramos is an 79 y/o female with h/o CAD, CHF, Parkinson's, aortic valve repair who presented to ED on 10/11/13 w/ c/o persistent nausea, vomiting, headache and dizziness. Pt admitted w/ differential of BPV versus migraine. Ct head w/o cm revealed no acute process. An MRI has been scheduled but has not been done at the time of this note. At bedside pt noted resting w/ eyes closed. She awakens easily and answers questions appropriately for me. Pt is afebrile, HR occassionally drops into 50's, remaining VSS. MAE's x 4, grips are equal bil. RN reports pt participated w/ PT today.  Assessment/Plan: 1. Increased lethargy: Exam is non-focal. Pt awakens easily and is oriented for me. Feel lethargy may be appropriate in hospitalized 79 y/o pt at 11pm. Will increase neuro-checks to q2h. Spoke w/ staff in MRI. They reported that floor was called today at 1400 and RN told them she needed to get an order for medication for pt prior to MRI. MRI staff did not hear back from her. They are unable to get MRI tonight unless we consult w/ radiologist to approve having on call staff called in. Do not feel stat MRI indicated unless there is an acute change in pt's status. If pt has any acute change will repeat ct head. Will continue to monitor closely.   Leanne Chang, NP-C [facility] Pager [phone]

## 2013-10-13 NOTE — Discharge Summary (Signed)
Physician Discharge Summary  Kayla Ramos:[id number] DOB: July 19, 1925 DOA: 10/11/2013  PCP: RECORD,CHARLES  Admit date: 10/11/2013 Discharge date: 10/13/2013  Time spent: 35 minutes  Recommendations for Outpatient Follow-up:  1. Needs to follow up with PCP for further evaluation and assessment of vertigo.   Discharge Diagnoses:    Vertigo     Encephalopathy, secondary to medications, morphine, sedatives.    S/P CABG x 3   Hyperlipidemia   HTN (hypertension)   S/P aortic valve repair   Nausea with vomiting   Headache(784.0)   Discharge Condition: Stable  Diet recommendation: Heart Healthy  Filed Weights   10/12/13 0223 10/12/13 0403 10/13/13 0405  Weight: 63.322 kg (139 lb 9.6 oz) 63.322 kg (139 lb 9.6 oz) 63.05 kg (139 lb)    History of present illness:  Kayla Ramos is a 79 y.o. female with history of CAD status post CABG, CHF, Parkinson's disease, PE, aortic valve repair (pig valve) presented to the ER with persistent nausea vomiting headache and dizziness. Patient states that her symptoms started off yesterday morning and had multiple episodes of nausea vomiting with global headache and she feels dizzy whenever she makes a move. Patient denies losing consciousness chest pain palpitations shortness of breath. Patient does not have any abdominal pain diarrhea and on exam patient's abdomen is benign. CT of the head was negative for anything acute. Patient's headache improved with morphine. Patient still has some dizziness on moving. On exam there is no obvious nystagmus. Patient denies any tinnitus or hearing loss. Patient has been admitted for further management. Patient was admitted last month for acute respiratory failure from bronchitis and CHF. Patient states that recently her Coumadin has been discontinued by her PCP. She was taking Coumadin for PE.    Hospital Course:  1-Vertigo: resolved. MRI negative for acute stroke. No nausea, vomiting.  2-Urine retention: Patient  was able to void today. Resolved.  3-Encephalopathy; patient was notice more sedated 10-09. Probably was related to multiple sedative. She takes valium. She received morphine. Patient today back to baseline. Tolerates diet. Will decrease Requip to once a day. Family decline SNF. Patient has 24 hours care.   Procedures:  MRI negative for stroke.   Consultations:  none  Discharge Exam: Filed Vitals:   10/13/13 1214  BP: 183/81  Pulse: 65  Temp: 98.3 F (36.8 C)  Resp: 16    General: No distress.  Cardiovascular: S 1, S 2 RRR Respiratory: CTA  Discharge Instructions  Discharge Orders   Future Orders Complete By Expires   Diet - low sodium heart healthy  As directed    Increase activity slowly  As directed        Medication List         aspirin EC 81 MG tablet  Take 81 mg by mouth daily.     atorvastatin 40 MG tablet  Commonly known as:  LIPITOR  Take 40 mg by mouth daily.     citalopram 10 MG tablet  Commonly known as:  CELEXA  Take 10 mg by mouth daily.     diazepam 5 MG tablet  Commonly known as:  VALIUM  Take 2.5 mg by mouth 2 (two) times daily. Take 1/2 tablet every morning and 1/2 tablet every evening at 5 pm.     felodipine 5 MG 24 hr tablet  Commonly known as:  PLENDIL  Take 5 mg by mouth daily.     gabapentin 300 MG capsule  Commonly known as:  NEURONTIN  Take 300 mg by mouth 2 (two) times daily. Take one capsule in the morning and 1 capsule at bedtime.     HYDROcodone-acetaminophen 7.5-325 MG per tablet  Commonly known as:  NORCO  Take 1 tablet by mouth every 6 (six) hours as needed for moderate pain.     lisinopril 10 MG tablet  Commonly known as:  PRINIVIL,ZESTRIL  Take 10 mg by mouth daily.     meclizine 25 MG tablet  Commonly known as:  ANTIVERT  Take 25 mg by mouth 3 (three) times daily as needed for dizziness.     ondansetron 4 MG disintegrating tablet  Commonly known as:  ZOFRAN ODT  Take 1 tablet (4 mg total) by mouth every 8  (eight) hours as needed for nausea or vomiting.     rOPINIRole 0.25 MG tablet  Commonly known as:  REQUIP  Take 0.5 tablets (0.125 mg total) by mouth at bedtime. Take 1/2 tablet every morning and 1 whole tablet in the evening at 5 pm.       No Known Allergies    The results of significant diagnostics from this hospitalization (including imaging, microbiology, ancillary and laboratory) are listed below for reference.    Significant Diagnostic Studies: Dg Chest 2 View  09/17/2013   CLINICAL DATA:  Cough pain, vomiting, shortness of breath  EXAM: CHEST  2 VIEW  COMPARISON:  09/07/2013; 03/02/2013; chest CT -03/02/2013  FINDINGS: Examination is degraded due to hypoventilation and patient body habitus.  Grossly unchanged enlarged cardiac silhouette and mediastinal contours given decreased lung volumes and patient rotation. Post median sternotomy and CABG. Atherosclerotic plaque within the thoracic aorta. Grossly unchanged bibasilar subsegmental atelectasis. Grossly unchanged atelectasis/scar within the peripheral aspect of the left mid lung. No new focal airspace opacities. No pleural effusion or pneumothorax. No definite evidence of edema. Grossly unchanged bones.  IMPRESSION: Stable findings of cardiomegaly and bibasilar atelectasis without definite acute cardiopulmonary disease.   Electronically Signed   By: Simonne Come M.D.   On: 09/17/2013 18:55   Ct Head Wo Contrast  10/12/2013   CLINICAL DATA:  Right-sided headache.  Nausea and vomiting.  EXAM: CT HEAD WITHOUT CONTRAST  TECHNIQUE: Contiguous axial images were obtained from the base of the skull through the vertex without intravenous contrast.  COMPARISON:  CT of the head and MRI of the brain performed 09/06/2011  FINDINGS: There is no evidence of acute infarction, mass lesion, or intra- or extra-axial hemorrhage on CT.  Prominence of the ventricles and sulci reflects mild to moderate cortical volume loss. Mild cerebellar atrophy is noted.  Scattered periventricular and subcortical white matter change likely reflects small vessel ischemic microangiopathy.  The posterior fossa, including the cerebellum, brainstem and fourth ventricle, is within normal limits. The third and lateral ventricles, and basal ganglia are unremarkable in appearance. The cerebral hemispheres are symmetric in appearance, with normal gray-white differentiation. No mass effect or midline shift is seen.  There is no evidence of fracture; visualized osseous structures are unremarkable in appearance. The orbits are within normal limits. The paranasal sinuses and mastoid air cells are well-aerated. No significant soft tissue abnormalities are seen.  IMPRESSION: 1. No acute intracranial pathology seen on CT. 2. Mild to moderate cortical volume loss and scattered small vessel ischemic microangiopathy.   Electronically Signed   By: Roanna Raider M.D.   On: 10/12/2013 00:20   Mr Brain Wo Contrast  10/13/2013   CLINICAL DATA:  Vertigo.  EXAM: MRI HEAD WITHOUT CONTRAST  TECHNIQUE: Multiplanar,  multiecho pulse sequences of the brain and surrounding structures were obtained without intravenous contrast.  COMPARISON:  Head CT 10/11/2013 and brain MRI 09/06/2011  FINDINGS: There is moderate generalized cerebral atrophy, unchanged. There is no evidence of acute infarct. T2 hyperintensities in the subcortical and deep cerebral white matter and pons are similar to the prior exam and consistent with moderate chronic small vessel ischemic disease. Small, remote infarcts are again noted involving the cerebellum, possibly with associated remote microhemorrhage. There is no evidence of acute intracranial hemorrhage, mass, midline shift, or extra-axial fluid collection. Intracranial vascular flow voids are unremarkable. Prior bilateral cataract surgery is noted. Visualized paranasal sinuses are clear.  IMPRESSION: No evidence of acute intracranial abnormality. Stable atrophy and chronic small vessel  ischemic change.   Electronically Signed   By: Sebastian Ache   On: 10/13/2013 10:34   Dg Chest Portable 1 View  10/11/2013   CLINICAL DATA:  Headache, nausea.  EXAM: PORTABLE CHEST - 1 VIEW  COMPARISON:  09/17/2013  FINDINGS: The patient has had median sternotomy and CABG. The heart is enlarged. The left costophrenic angle is excluded. There is no evidence for pulmonary edema. No focal consolidations or definite pleural effusions are identified.  IMPRESSION: Cardiomegaly.   Electronically Signed   By: Rosalie Gums M.D.   On: 10/11/2013 23:45    Microbiology: No results found for this or any previous visit (from the past 240 hour(s)).   Labs: Basic Metabolic Panel:  Recent Labs Lab 10/11/13 2217 10/12/13 0545  NA 137 138  K 3.7 4.1  CL 99 102  CO2 25 27  GLUCOSE 160* 106*  BUN 13 12  CREATININE 0.85 0.91  CALCIUM 8.8 8.4   Liver Function Tests:  Recent Labs Lab 10/11/13 2217 10/12/13 0545  AST 23 17  ALT 17 13  ALKPHOS 106 89  BILITOT 1.8* 1.5*  PROT 7.0 6.1  ALBUMIN 4.0 3.2*   No results found for this basename: LIPASE, AMYLASE,  in the last 168 hours No results found for this basename: AMMONIA,  in the last 168 hours CBC:  Recent Labs Lab 10/11/13 2217 10/12/13 0545  WBC 7.2 6.2  NEUTROABS 6.1  --   HGB 14.9 13.6  HCT 43.3 39.7  MCV 91.9 92.5  PLT 203 208   Cardiac Enzymes: No results found for this basename: CKTOTAL, CKMB, CKMBINDEX, TROPONINI,  in the last 168 hours BNP: BNP (last 3 results)  Recent Labs  03/02/13 1235 09/07/13 0742 09/17/13 1754  PROBNP 588.1* 1089.0* 675.2*   CBG:  Recent Labs Lab 10/11/13 2242  GLUCAP 206*       Signed:  Breanda Greenlaw  [facility] 10/13/2013, 1:37 PM

## 2013-10-13 NOTE — Progress Notes (Addendum)
Clinical Social Work Department CLINICAL SOCIAL WORK PLACEMENT NOTE 10/13/2013  Patient:  Kayla Ramos, Kayla Ramos  Account Number:  [id number] Admit date:  10/11/2013  Clinical Social Worker:  Harless Nakayama  Date/time:  10/13/2013 11:20 AM  Clinical Social Work is seeking post-discharge placement for this patient at the following level of care:   SKILLED NURSING   (*CSW will update this form in Epic as items are completed)   10/13/2013  Patient/family provided with [facility] Department of Clinical Social Work's list of facilities offering this level of care within the geographic area requested by the patient (or if unable, by the patient's family).  10/13/2013  Patient/family informed of their freedom to choose among providers that offer the needed level of care, that participate in Medicare, Medicaid or managed care program needed by the patient, have an available bed and are willing to accept the patient.  10/13/2013  Patient/family informed of [facility]' ownership interest in [facility], as well as of the fact that they are under no obligation to receive care at this facility.  PASARR submitted to EDS on EXISTING PASARR number received from EDS on   FL2 transmitted to all facilities in geographic area requested by pt/family on  10/13/2013 FL2 transmitted to all facilities within larger geographic area on   Patient informed that his/her managed care company has contracts with or will negotiate with  certain facilities, including the following:     Patient/family informed of bed offers received:  10/13/2013 Patient chooses bed at ---- Physician recommends and patient chooses bed at    Patient to be transferred to HOME  on  10/13/2013 Patient to be transferred to facility by   The following physician request were entered in Epic:   Additional Comments:  Norris Brumbach, LCSWA [phone]

## 2013-10-13 NOTE — Care Management Note (Signed)
    Page 1 of 1   10/13/2013     1:42:07 PM   CARE MANAGEMENT NOTE 10/13/2013  Patient:  Kayla Ramos, Kayla Ramos   Account Number:  [id number]  Date Initiated:  10/13/2013  Documentation initiated by:  GRAVES-BIGELOW,Ludwig Tugwell  Subjective/Objective Assessment:   Pt admitted for N/V and headache. Pt is from home with 24 hr supervision. Pt will return home today via ambulance.     Action/Plan:   Pt is active with [facility] and orders placed for resumption. Cm spoke to granddaughter and she is agreeable to stay with [facility].   Anticipated DC Date:  10/13/2013   Anticipated DC Plan:  HOME W HOME HEALTH SERVICES      DC Planning Services  CM consult      [facility] Choice  HOME HEALTH  Resumption Of Svcs/PTA Provider   Choice offered to / List presented to:  C-4 Adult Children        HH arranged  HH-1 RN  HH-10 DISEASE MANAGEMENT  HH-2 PT      [facility] agency  [facility]   Status of service:  Completed, signed off Medicare Important Message given?   (If response is "NO", the following Medicare IM given date fields will be blank) Date Medicare IM given:   Date Additional Medicare IM given:    Discharge Disposition:  HOME W HOME HEALTH SERVICES  Per UR Regulation:  Reviewed for med. necessity/level of care/duration of stay  If discussed at Long Length of Stay Meetings, dates discussed:    Comments:

## 2013-10-13 NOTE — Progress Notes (Signed)
CSW Proofreader) spoke with pt granddaughter who informed CSW that pt would be discharging home. CSW informed CM. CSW also informed pt will need non-emergent ambulance transport. CSW verified address with granddaughter and called for transport. Pt nurse informed. CSW signing off.  Sheylin Scharnhorst, LCSWA [phone]

## 2013-10-13 NOTE — Progress Notes (Signed)
Clinical Social Work Department BRIEF PSYCHOSOCIAL ASSESSMENT 10/13/2013  Patient:  Kayla, Ramos     Account Number:  [id number]     Admit date:  10/11/2013  Clinical Social Worker:  Harless Nakayama  Date/Time:  10/13/2013 11:15 AM  Referred by:  Physician  Date Referred:  10/13/2013 Referred for  SNF Placement   Other Referral:   Interview type:  Patient Other interview type:   Spoke with pt at bedside and pt granddaughter over the phone    PSYCHOSOCIAL DATA Living Status:  FAMILY Admitted from facility:   Level of care:   Primary support name:  Kayla Ramos [phone] Primary support relationship to patient:  FAMILY Degree of support available:   Pt has good family support system    CURRENT CONCERNS Current Concerns  Post-Acute Placement   Other Concerns:    SOCIAL WORK ASSESSMENT / PLAN CSW informed that PT is recommending SNF for ST rehab. CSW spoke with pt about recommendation. Pt is not sure if she is open to going to a SNF. Pt informed CSW she has 24 hr care and gets PT at home on MWF. CSW explained the benefits of ST rehab over [facility] and why PT may be making this recommendation. Pt said she was unsure and CSW should call her granddaughter Kayla Ramos to discuss this. CSW called Kayla Ramos and spoke with her about PT recommendation. Pt granddaughter also informed CSW about resources pt is already utilizing and CSW explained why pt would benefit from SNF over [facility]. Pt granddaughter informed CSW that pt was recently at [facility] and did not enjoy it and that is hwy they are questioning SNF this admission. CSW explained SNF referral process. Pt granddaughter is open to pt being referred out however did clarify multiple times that this did not mean they were committed to this dc plan. CSW informed CM about situation and possible dc home with HH instead of SNF.    CSW has faxed clinicals to [facility].   Assessment/plan status:  Psychosocial Support/Ongoing Assessment of  Needs Other assessment/ plan:   Information/referral to community resources:   SNF list to be provided with bed offers.    PATIENT'S/FAMILY'S RESPONSE TO PLAN OF CARE: Pt and pt family both unsure of dc plan at this time.       Peggie Hornak, LCSWA [phone]

## 2014-02-11 ENCOUNTER — Encounter (HOSPITAL_COMMUNITY): Payer: Self-pay | Admitting: Emergency Medicine

## 2014-02-11 ENCOUNTER — Emergency Department (HOSPITAL_COMMUNITY): Payer: Medicare Other

## 2014-02-11 ENCOUNTER — Other Ambulatory Visit: Payer: Self-pay

## 2014-02-11 ENCOUNTER — Inpatient Hospital Stay (HOSPITAL_COMMUNITY)
Admission: EM | Admit: 2014-02-11 | Discharge: 2014-02-15 | DRG: 683 | Disposition: A | Discharge status: Skilled Nursing Facility | Payer: Medicare Other | Attending: Internal Medicine | Admitting: Internal Medicine

## 2014-02-11 DIAGNOSIS — G909 Disorder of the autonomic nervous system, unspecified: Secondary | ICD-10-CM | POA: Diagnosis present

## 2014-02-11 DIAGNOSIS — R55 Syncope and collapse: Secondary | ICD-10-CM

## 2014-02-11 DIAGNOSIS — Y92009 Unspecified place in unspecified non-institutional (private) residence as the place of occurrence of the external cause: Secondary | ICD-10-CM

## 2014-02-11 DIAGNOSIS — K219 Gastro-esophageal reflux disease without esophagitis: Secondary | ICD-10-CM | POA: Diagnosis present

## 2014-02-11 DIAGNOSIS — I6509 Occlusion and stenosis of unspecified vertebral artery: Secondary | ICD-10-CM

## 2014-02-11 DIAGNOSIS — Z951 Presence of aortocoronary bypass graft: Secondary | ICD-10-CM

## 2014-02-11 DIAGNOSIS — I6501 Occlusion and stenosis of right vertebral artery: Secondary | ICD-10-CM

## 2014-02-11 DIAGNOSIS — M545 Low back pain, unspecified: Secondary | ICD-10-CM | POA: Diagnosis present

## 2014-02-11 DIAGNOSIS — W19XXXA Unspecified fall, initial encounter: Secondary | ICD-10-CM | POA: Diagnosis present

## 2014-02-11 DIAGNOSIS — I2699 Other pulmonary embolism without acute cor pulmonale: Secondary | ICD-10-CM

## 2014-02-11 DIAGNOSIS — G459 Transient cerebral ischemic attack, unspecified: Secondary | ICD-10-CM

## 2014-02-11 DIAGNOSIS — Z833 Family history of diabetes mellitus: Secondary | ICD-10-CM

## 2014-02-11 DIAGNOSIS — Z9849 Cataract extraction status, unspecified eye: Secondary | ICD-10-CM

## 2014-02-11 DIAGNOSIS — R5381 Other malaise: Secondary | ICD-10-CM | POA: Diagnosis present

## 2014-02-11 DIAGNOSIS — N183 Chronic kidney disease, stage 3 unspecified: Secondary | ICD-10-CM

## 2014-02-11 DIAGNOSIS — M419 Scoliosis, unspecified: Secondary | ICD-10-CM

## 2014-02-11 DIAGNOSIS — Z8249 Family history of ischemic heart disease and other diseases of the circulatory system: Secondary | ICD-10-CM

## 2014-02-11 DIAGNOSIS — E876 Hypokalemia: Secondary | ICD-10-CM

## 2014-02-11 DIAGNOSIS — N179 Acute kidney failure, unspecified: Principal | ICD-10-CM | POA: Diagnosis present

## 2014-02-11 DIAGNOSIS — R42 Dizziness and giddiness: Secondary | ICD-10-CM

## 2014-02-11 DIAGNOSIS — Z8673 Personal history of transient ischemic attack (TIA), and cerebral infarction without residual deficits: Secondary | ICD-10-CM

## 2014-02-11 DIAGNOSIS — R0902 Hypoxemia: Secondary | ICD-10-CM

## 2014-02-11 DIAGNOSIS — Z86711 Personal history of pulmonary embolism: Secondary | ICD-10-CM

## 2014-02-11 DIAGNOSIS — M25559 Pain in unspecified hip: Secondary | ICD-10-CM | POA: Diagnosis present

## 2014-02-11 DIAGNOSIS — I5042 Chronic combined systolic (congestive) and diastolic (congestive) heart failure: Secondary | ICD-10-CM

## 2014-02-11 DIAGNOSIS — R001 Bradycardia, unspecified: Secondary | ICD-10-CM

## 2014-02-11 DIAGNOSIS — I5043 Acute on chronic combined systolic (congestive) and diastolic (congestive) heart failure: Secondary | ICD-10-CM

## 2014-02-11 DIAGNOSIS — Z9889 Other specified postprocedural states: Secondary | ICD-10-CM

## 2014-02-11 DIAGNOSIS — G20 Parkinson's disease: Secondary | ICD-10-CM | POA: Diagnosis present

## 2014-02-11 DIAGNOSIS — I129 Hypertensive chronic kidney disease with stage 1 through stage 4 chronic kidney disease, or unspecified chronic kidney disease: Secondary | ICD-10-CM | POA: Diagnosis present

## 2014-02-11 DIAGNOSIS — R739 Hyperglycemia, unspecified: Secondary | ICD-10-CM

## 2014-02-11 DIAGNOSIS — R5383 Other fatigue: Secondary | ICD-10-CM

## 2014-02-11 DIAGNOSIS — E785 Hyperlipidemia, unspecified: Secondary | ICD-10-CM

## 2014-02-11 DIAGNOSIS — I2510 Atherosclerotic heart disease of native coronary artery without angina pectoris: Secondary | ICD-10-CM

## 2014-02-11 DIAGNOSIS — F411 Generalized anxiety disorder: Secondary | ICD-10-CM | POA: Diagnosis present

## 2014-02-11 DIAGNOSIS — G8929 Other chronic pain: Secondary | ICD-10-CM

## 2014-02-11 DIAGNOSIS — E860 Dehydration: Secondary | ICD-10-CM

## 2014-02-11 DIAGNOSIS — Z7982 Long term (current) use of aspirin: Secondary | ICD-10-CM

## 2014-02-11 DIAGNOSIS — Z952 Presence of prosthetic heart valve: Secondary | ICD-10-CM

## 2014-02-11 DIAGNOSIS — M4120 Other idiopathic scoliosis, site unspecified: Secondary | ICD-10-CM | POA: Diagnosis present

## 2014-02-11 DIAGNOSIS — J209 Acute bronchitis, unspecified: Secondary | ICD-10-CM

## 2014-02-11 DIAGNOSIS — Z9181 History of falling: Secondary | ICD-10-CM

## 2014-02-11 DIAGNOSIS — I509 Heart failure, unspecified: Secondary | ICD-10-CM | POA: Diagnosis present

## 2014-02-11 DIAGNOSIS — I10 Essential (primary) hypertension: Secondary | ICD-10-CM

## 2014-02-11 DIAGNOSIS — J9600 Acute respiratory failure, unspecified whether with hypoxia or hypercapnia: Secondary | ICD-10-CM

## 2014-02-11 DIAGNOSIS — R112 Nausea with vomiting, unspecified: Secondary | ICD-10-CM

## 2014-02-11 DIAGNOSIS — Z79899 Other long term (current) drug therapy: Secondary | ICD-10-CM

## 2014-02-11 DIAGNOSIS — M7989 Other specified soft tissue disorders: Secondary | ICD-10-CM

## 2014-02-11 DIAGNOSIS — M549 Dorsalgia, unspecified: Secondary | ICD-10-CM

## 2014-02-11 DIAGNOSIS — G20A1 Parkinson's disease without dyskinesia, without mention of fluctuations: Secondary | ICD-10-CM

## 2014-02-11 DIAGNOSIS — R531 Weakness: Secondary | ICD-10-CM

## 2014-02-11 DIAGNOSIS — R51 Headache: Secondary | ICD-10-CM

## 2014-02-11 LAB — BASIC METABOLIC PANEL
BUN: 17 mg/dL (ref 6–23)
CHLORIDE: 101 meq/L (ref 96–112)
CO2: 27 meq/L (ref 19–32)
CREATININE: 1.27 mg/dL — AB (ref 0.50–1.10)
Calcium: 8.6 mg/dL (ref 8.4–10.5)
GFR calc non Af Amer: 37 mL/min — ABNORMAL LOW (ref >90)
GFR, EST AFRICAN AMERICAN: 43 mL/min — AB (ref >90)
Glucose, Bld: 183 mg/dL — ABNORMAL HIGH (ref 70–99)
Potassium: 3.4 mEq/L — ABNORMAL LOW (ref 3.7–5.3)
SODIUM: 142 meq/L (ref 137–147)

## 2014-02-11 LAB — CBC WITH DIFFERENTIAL/PLATELET
BASOS ABS: 0 10*3/uL (ref 0.0–0.1)
Basophils Relative: 0 % (ref 0–1)
EOS PCT: 1 % (ref 0–5)
Eosinophils Absolute: 0.1 10*3/uL (ref 0.0–0.7)
HEMATOCRIT: 38.9 % (ref 36.0–46.0)
Hemoglobin: 13 g/dL (ref 12.0–15.0)
Lymphocytes Relative: 10 % — ABNORMAL LOW (ref 12–46)
Lymphs Abs: 0.9 10*3/uL (ref 0.7–4.0)
MCH: 31 pg (ref 26.0–34.0)
MCHC: 33.4 g/dL (ref 30.0–36.0)
MCV: 92.6 fL (ref 78.0–100.0)
MONO ABS: 0.8 10*3/uL (ref 0.1–1.0)
Monocytes Relative: 8 % (ref 3–12)
NEUTROS ABS: 7.5 10*3/uL (ref 1.7–7.7)
Neutrophils Relative %: 81 % — ABNORMAL HIGH (ref 43–77)
PLATELETS: 219 10*3/uL (ref 150–400)
RBC: 4.2 MIL/uL (ref 3.87–5.11)
RDW: 13.5 % (ref 11.5–15.5)
WBC: 9.3 10*3/uL (ref 4.0–10.5)

## 2014-02-11 LAB — I-STAT TROPONIN, ED: Troponin i, poc: 0.02 ng/mL (ref 0.00–0.08)

## 2014-02-11 LAB — PRO B NATRIURETIC PEPTIDE: PRO B NATRI PEPTIDE: 1527 pg/mL — AB (ref 0–450)

## 2014-02-11 MED ORDER — SODIUM CHLORIDE 0.9 % IV BOLUS (SEPSIS)
500.0000 mL | Freq: Once | INTRAVENOUS | Status: AC
  Administered 2014-02-11: 500 mL via INTRAVENOUS

## 2014-02-11 NOTE — ED Notes (Signed)
Patient transported to X-ray 

## 2014-02-11 NOTE — ED Notes (Signed)
Per EMS pt fell in her bathroom attempting to get off the toilet.  Pt denies LOC or hitting her head.  Pt noted to be weak.  Pt is A&O x4.

## 2014-02-11 NOTE — ED Notes (Signed)
EKG given to EDP,Yao,MD., for review. 

## 2014-02-11 NOTE — ED Notes (Signed)
Pt is A&O x3.  No c/o pain.  No sx of injury sustained during fall.

## 2014-02-11 NOTE — ED Provider Notes (Signed)
CSN: [id number]     Arrival date & time 02/11/14  1927 History   First MD Initiated Contact with Patient 02/11/14 2022     Chief Complaint  Patient presents with  . Fall     (Consider location/radiation/quality/duration/timing/severity/associated sxs/prior Treatment) The history is provided by the patient and a relative.  Kayla Ramos is an 80 year old female that resides at home with past medical history of hypertension, vertigo, coronary artery disease, bilateral PE, chronic back pain, anxiety, TIA, GERD, Parkinson's disease presenting to the ED with a fall that occurred this afternoon. As per patient and daughter-in-law's report, stated that patient was going to sit down on the toilet but was too far in front of the toilet and landed on her buttocks. Patient and daughter-in-law denied hitting head or neck. Patient denied pain or discomfort. Daughter-in-law reported that patient has been falling frequently-reported approximately 3 falls in the past couple of weeks. While in the ED, patient is pulse ox dropped to 88% on room air-when asked the patient has oxygen therapy at home, both patient and daughter-in-law denied. Patient was placed on oxygen via nasal cannula 2 L per minute with a pulse ox of 95%. Patient reported that she's been feeling tired and weak. Denied chest pain, shortness of breath, difficulty breathing, head injury, neck pain, loss of consciousness, headaches, dizziness, back pain, hip pain, arm pain, numbness, tingling, nausea, vomiting. PCP Dr. Record   As per daughter-in-law, reported that patient used to be on anticoagulation therapy regarding PE - coumadin - but, reported that patient's medications were discontinued, daughter-in-law does not know the reason.   Past Medical History  Diagnosis Date  . Hypertension   . Vertigo     "it's gotten better" (03/02/2013)  . CAD (coronary artery disease) 2000    s/p CABG   . Hyperlipidemia   . Scoliosis   . Vertebral artery  stenosis 2006    75-90% per MR/MRA in 2006   . Pulmonary embolism 03/02/2013    "bilaterally; small" (03/02/2013)  . Exertional shortness of breath     "recently" (03/02/2013)  . Daily headache     "sometimes" (03/02/2013)  . GERD (gastroesophageal reflux disease)     "used to be bad; not so anymore" (03/02/2013)  . Arthritis     "in her back" (03/02/2013)  . TIA (transient ischemic attack) 07/2011  . Chronic lower back pain   . Anxiety   . Parkinson disease     "dx'd just 2 wk ago" (09/07/2013)   Past Surgical History  Procedure Laterality Date  . Aortic valve replacement  2000    "pig valve" (03/02/2013)  . Cholecystectomy  2000  . Vaginal hysterectomy  ~ 1987  . Coronary artery bypass graft  2000    "CABG X?" (03/02/2013)  . Cataract extraction w/ intraocular lens  implant, bilateral Bilateral 2005  . Cardiac valve replacement    . Coronary angioplasty  1989   No family history on file. History  Substance Use Topics  . Smoking status: Never Smoker   . Smokeless tobacco: Never Used  . Alcohol Use: No   OB History   Grav Para Term Preterm Abortions TAB SAB Ect Mult Living                 Review of Systems  Constitutional: Negative for fever and chills.  Respiratory: Negative for chest tightness and shortness of breath.   Cardiovascular: Negative for chest pain.  Gastrointestinal: Negative for nausea, vomiting, abdominal pain and  diarrhea.  Musculoskeletal: Negative for back pain and neck pain.  Neurological: Positive for dizziness. Negative for weakness.  All other systems reviewed and are negative.     Allergies  Carbidopa-levodopa  Home Medications   Current Outpatient Rx  Name  Route  Sig  Dispense  Refill  . acetaminophen (TYLENOL) 325 MG tablet   Oral   Take 650 mg by mouth every 6 (six) hours as needed for mild pain or headache.          Marland Kitchen aspirin EC 81 MG tablet   Oral   Take 81 mg by mouth daily.         Marland Kitchen atorvastatin (LIPITOR) 40 MG tablet    Oral   Take 40 mg by mouth daily.           . citalopram (CELEXA) 10 MG tablet   Oral   Take 10 mg by mouth daily.         . diazepam (VALIUM) 5 MG tablet   Oral   Take 2.5 mg by mouth 2 (two) times daily. Take 1/2 tablet every morning and 1/2 tablet every evening at 5 pm.         . felodipine (PLENDIL) 5 MG 24 hr tablet   Oral   Take 5 mg by mouth daily.           Marland Kitchen gabapentin (NEURONTIN) 300 MG capsule   Oral   Take 300 mg by mouth 2 (two) times daily. Take one capsule in the morning and 1 capsule at bedtime.         . hydrochlorothiazide (MICROZIDE) 12.5 MG capsule   Oral   Take 1 capsule by mouth every morning.         Marland Kitchen HYDROcodone-acetaminophen (NORCO) 7.5-325 MG per tablet   Oral   Take 1 tablet by mouth every 6 (six) hours as needed for moderate pain.         Marland Kitchen lisinopril (PRINIVIL,ZESTRIL) 10 MG tablet   Oral   Take 10 mg by mouth daily.         . meclizine (ANTIVERT) 25 MG tablet   Oral   Take 25 mg by mouth 3 (three) times daily as needed for dizziness.         Marland Kitchen rOPINIRole (REQUIP) 0.25 MG tablet   Oral   Take 0.125-0.25 mg by mouth 2 (two) times daily. Take 1/2 tablet every morning and 1 whole tablet in the evening at 5 pm.          BP 141/62  Pulse 60  Temp(Src) 98.2 F (36.8 C) (Oral)  Resp 16  SpO2 96% Physical Exam  Nursing note and vitals reviewed. Constitutional: She is oriented to person, place, and time. She appears well-developed and well-nourished. No distress.  HENT:  Head: Normocephalic and atraumatic.  Mouth/Throat: No oropharyngeal exudate.  Dry mucous membranes Negative facial trauma noted Negative palpation hematomas  Eyes: Conjunctivae and EOM are normal. Pupils are equal, round, and reactive to light. Right eye exhibits no discharge. Left eye exhibits no discharge.  Neck: Normal range of motion. Neck supple. No tracheal deviation present.  Negative neck stiffness Negative rigidity Negative cervical  lymphadenopathy Negative pain upon palpation to the c-spine or musculature of the neck  Cardiovascular: Normal rate and regular rhythm.  Exam reveals no friction rub.   No murmur heard. Pulmonary/Chest: Effort normal and breath sounds normal. No respiratory distress. She has no wheezes. She has no rales.  Abdominal: Soft. Bowel  sounds are normal. There is no tenderness. There is no guarding.  Musculoskeletal: Normal range of motion.  Full ROM to upper and lower extremities without difficulty noted, negative ataxia noted. Negative swelling, erythema, formation, lesions, sores, deformities noted to the cervical/thoracic/lumbosacral spine/coccyx. Negative pain upon palpation to the cervical/thoracic/lumbosacral spine. Full range of motion to the lower tremors bilaterally without difficulty noted. Negative pain upon palpation to the hips bilaterally or pelvic girdle. Negative signs of crepitus.  Lymphadenopathy:    She has no cervical adenopathy.  Neurological: She is alert and oriented to person, place, and time. No cranial nerve deficit. She exhibits normal muscle tone. Coordination normal.  Cranial nerves III-XII grossly intact Strength 5+/5+ to upper and lower extremities bilaterally with resistance applied, equal distribution noted Sensation intact with differentiation to sharp and dull touch  Skin: Skin is warm and dry. No rash noted. She is not diaphoretic. No erythema.  Psychiatric: She has a normal mood and affect. Her behavior is normal. Thought content normal.    ED Course  Procedures (including critical care time)  12:36 AM This provider reassess the patient and discussed with and imaging in great detail with patient and daughter alive bedside. Patient removed from his nasal cannula. While on nasal cannula pulse ox was 96%, once removed pulse ox trending down to 89%. This provider spoke with CT tech - discussed creatinine, GFR and weight. CT tech recommended order for CT angio to be  placed.   1:23 AM This provider was made aware that based on patient's GFR CT angio could not be performed. CT angio discontinued. Patient to be admitted for VQ scan to be performed.   2:32 AM this provider spoke with Dr. Adela Glimpse - discussed labs, imaging, vitals history, presentation in great detail. Admitting physician to come and assess patient and place orders. Patient to be admitted to the hospital.  Results for orders placed during the hospital encounter of 02/11/14  BASIC METABOLIC PANEL      Result Value Ref Range   Sodium 142  137 - 147 mEq/L   Potassium 3.4 (*) 3.7 - 5.3 mEq/L   Chloride 101  96 - 112 mEq/L   CO2 27  19 - 32 mEq/L   Glucose, Bld 183 (*) 70 - 99 mg/dL   BUN 17  6 - 23 mg/dL   Creatinine, Ser 1.61 (*) 0.50 - 1.10 mg/dL   Calcium 8.6  8.4 - 09.6 mg/dL   GFR calc non Af Amer 37 (*) >90 mL/min   GFR calc Af Amer 43 (*) >90 mL/min  CBC WITH DIFFERENTIAL      Result Value Ref Range   WBC 9.3  4.0 - 10.5 K/uL   RBC 4.20  3.87 - 5.11 MIL/uL   Hemoglobin 13.0  12.0 - 15.0 g/dL   HCT 04.5  40.9 - 81.1 %   MCV 92.6  78.0 - 100.0 fL   MCH 31.0  26.0 - 34.0 pg   MCHC 33.4  30.0 - 36.0 g/dL   RDW 91.4  78.2 - 95.6 %   Platelets 219  150 - 400 K/uL   Neutrophils Relative % 81 (*) 43 - 77 %   Neutro Abs 7.5  1.7 - 7.7 K/uL   Lymphocytes Relative 10 (*) 12 - 46 %   Lymphs Abs 0.9  0.7 - 4.0 K/uL   Monocytes Relative 8  3 - 12 %   Monocytes Absolute 0.8  0.1 - 1.0 K/uL   Eosinophils Relative 1  0 - 5 %   Eosinophils Absolute 0.1  0.0 - 0.7 K/uL   Basophils Relative 0  0 - 1 %   Basophils Absolute 0.0  0.0 - 0.1 K/uL  PRO B NATRIURETIC PEPTIDE      Result Value Ref Range   Pro B Natriuretic peptide (BNP) 1527.0 (*) 0 - 450 pg/mL  I-STAT TROPOININ, ED      Result Value Ref Range   Troponin i, poc 0.02  0.00 - 0.08 ng/mL   Comment 3             Labs Review Labs Reviewed  BASIC METABOLIC PANEL - Abnormal; Notable for the following:    Potassium 3.4 (*)     Glucose, Bld 183 (*)    Creatinine, Ser 1.27 (*)    GFR calc non Af Amer 37 (*)    GFR calc Af Amer 43 (*)    All other components within normal limits  CBC WITH DIFFERENTIAL - Abnormal; Notable for the following:    Neutrophils Relative % 81 (*)    Lymphocytes Relative 10 (*)    All other components within normal limits  PRO B NATRIURETIC PEPTIDE - Abnormal; Notable for the following:    Pro B Natriuretic peptide (BNP) 1527.0 (*)    All other components within normal limits  I-STAT TROPOININ, ED   Imaging Review Dg Chest 1 View  02/11/2014   CLINICAL DATA:  Bilateral hip pain after a fall.  EXAM: CHEST - 1 VIEW  COMPARISON:  10/11/2013  FINDINGS: Postoperative changes in the mediastinum. Shallow inspiration. Mild Cardiac enlargement with normal pulmonary vascularity. Calcified and tortuous aorta. No focal airspace disease or pneumothorax.  IMPRESSION: No active disease.   Electronically Signed   By: Burman Nieves M.D.   On: 02/11/2014 23:42   Dg Hip Bilateral W/pelvis  02/11/2014   CLINICAL DATA:  Bilateral hip pain after a fall.  EXAM: BILATERAL HIP WITH PELVIS - 4+ VIEW  COMPARISON:  MRI lumbar spine 02/04/2009.  Abdomen 07/05/2005  FINDINGS: The pelvis and hips appear intact. No displaced fractures are identified. Degenerative changes in the lower lumbar spine and both hips. SI joints and symphysis pubis are not displaced.  IMPRESSION: No acute bony abnormalities demonstrated.   Electronically Signed   By: Burman Nieves M.D.   On: 02/11/2014 23:41     EKG Interpretation None      MDM   Final diagnoses:  Hypoxia  CAD (coronary artery disease)  CKD (chronic kidney disease), stage III  Dehydration   Medications  sodium chloride 0.9 % bolus 500 mL (500 mLs Intravenous New Bag/Given 02/11/14 2314)   Filed Vitals:   02/11/14 1927 02/11/14 1934 02/12/14 0025 02/12/14 0121  BP:  98/55 141/69 141/62  Pulse:  76 63 60  Temp:  98.2 F (36.8 C)    TempSrc:  Oral    Resp:  23  16 16   SpO2: 100% 90% 96% 96%    Patient presented to the ED with a fall that occurred this afternoon. Reported that when going to sit down to the commode patient missed the trouble and landed on her buttocks. Denied discomfort or pain. As per daughter-in-law who accompanies patient, reported that patient has been having recent falls-reported approximately 3 falls over the past week.  This provider reviewed patient's chart. Patient has history of PE - CT angiogram performed in April 2014 that identified a small bilateral lower lobe pulmonary embolisms.  Alert. Patient responds well to questions  and follows commands. Heart rate and rhythm normal. Lungs noted to be minimally decreased to upper and lower lobes bilaterally-negative rhonchi or rales or crackles heard. Refill less than 3 seconds. Radial and DP pulses 2+ bilaterally. Negative swelling or pitting edema identified to lower extremities bilaterally. Full range of motion to upper and lower extremities bilaterally without difficulty noted.  Negative swelling, erythema, formation, lesions, sores, deformities noted to the cervical/thoracic/lumbosacral spine-negative pain upon palpation to these regions. Sensation intact with differentiation sharp and dull touch. Strength intact with equal distribution, equal grip strength. While in ED setting, patient's pulse ox to 88% on repair patient was placed on nasal cannula 2 L per minute with pulse ox pain 95%. Patient's blood pressure dropped to 81/53 without symptoms noted. Patient placed on IV fluids. EKG noted normal sinus rhythm with a heart rate of 66 beats per minute. Troponin negative elevation. CBC negative elevation white blood cell count identified. BMP noted elevated creatinine 1.27 - when compared to 4 months ago creatinine was 0.91. BNP 1527-increased when compared to 4 months ago when BNP was 675.2. Chest x-ray negative for acute cardiac pulmonary disease. Plain film of bilateral hip with pelvis  negative for acute bony abnormalities. Do to poor kidney function patient able to obtain CT angiogram of the chest. Patient has history of pulmonary embolisms, no longer on anticoagulation therapy-was taking Coumadin. Patient presenting to the ED with hypoxia - negative infectious findings. Discussed case with hospitalist patient to be admitted to hospital regarding dehydration, hypoxia. VQ scan ordered - rule out possible PE.  Raymon Mutton, PA-C 02/12/14 1206  Raymon Mutton, PA-C 02/12/14 1208

## 2014-02-11 NOTE — Progress Notes (Signed)
   CARE MANAGEMENT ED NOTE 02/11/2014  Patient:  Kayla Ramos,Kayla Ramos   Account Number:  [id number]  Date Initiated:  02/11/2014  Documentation initiated by:  Radford PaxFERRERO,Verdean Murin  Subjective/Objective Assessment:   Patient presents to Ed post fall in her bathroom, no LOC     Subjective/Objective Assessment Detail:   Pulse ox 88% on room air oxygen applied at 2 liters.     Action/Plan:   Action/Plan Detail:   Anticipated DC Date:       Status Recommendation to Physician:   Result of Recommendation:    Other ED Services  Consult Working Plan    DC [employer]  Other    Choice offered to / List presented to:            Status of service:  Completed, signed off  ED Comments:   ED Comments Detail:  EDCM spoke to patient at bedside.  Patient reports she lives at home by herself but she is never alone.  Patient reports she receives 24/7 care from friends. Patient's friends help her with her ADL's and meals.   Patient also has a grand daughter who is a Engineer, siteschool teacher who checks in on her after school.  Patient reports,  "I also have good neighbors."  Patient has a walker, shower chair, elevated toilet seat and wheelchair at home.  Patient reports she uses the wheelchair to "roll around the house in."  Patient does not wear oxygen at home.  Patient reports she home health services that just started today.  Patient reports she saw someone for PT today but she cannot remember the name of the agency.  Patient thinks [facility] sounds familiar. EDCM provided patient with [place]  Patient thankful for resources.  No further EDCM needs at this time.

## 2014-02-11 NOTE — ED Notes (Addendum)
Pt's O2 sats noted to be 88% on RA. O2 did not respond at 1L so O2 increased to 2L via [place].

## 2014-02-11 NOTE — ED Notes (Signed)
Notified RN, Sherrilyn RistKari pt. Oxygen level 90% on room air. Pt. Placed on 1 liter oxygen.

## 2014-02-11 NOTE — ED Provider Notes (Signed)
Patient seen/examined in the Emergency Department in conjunction with Midlevel Provider [place] Patient reports she fell while in bathroom . While in ED she was noted to be hypotensive and hypoxic Exam : awake/alert, no distress.  She has no signs of traumatic injury from fall Plan: imaging/labs pending.  Suspect pt will need admission.     Joya Gaskinsonald W Marl Seago, MD 02/11/14 2257

## 2014-02-11 NOTE — ED Notes (Signed)
Bed: WA25 Expected date:  Expected time:  Means of arrival:  Comments: EMS  

## 2014-02-12 ENCOUNTER — Other Ambulatory Visit (HOSPITAL_COMMUNITY): Payer: Medicare Other

## 2014-02-12 ENCOUNTER — Encounter (HOSPITAL_COMMUNITY): Payer: Self-pay | Admitting: Internal Medicine

## 2014-02-12 ENCOUNTER — Inpatient Hospital Stay (HOSPITAL_COMMUNITY): Payer: Medicare Other

## 2014-02-12 DIAGNOSIS — E860 Dehydration: Secondary | ICD-10-CM | POA: Diagnosis present

## 2014-02-12 DIAGNOSIS — R0902 Hypoxemia: Secondary | ICD-10-CM | POA: Diagnosis present

## 2014-02-12 DIAGNOSIS — I369 Nonrheumatic tricuspid valve disorder, unspecified: Secondary | ICD-10-CM

## 2014-02-12 DIAGNOSIS — I2510 Atherosclerotic heart disease of native coronary artery without angina pectoris: Secondary | ICD-10-CM

## 2014-02-12 DIAGNOSIS — I5042 Chronic combined systolic (congestive) and diastolic (congestive) heart failure: Secondary | ICD-10-CM | POA: Diagnosis present

## 2014-02-12 DIAGNOSIS — N183 Chronic kidney disease, stage 3 unspecified: Secondary | ICD-10-CM

## 2014-02-12 LAB — URINALYSIS, ROUTINE W REFLEX MICROSCOPIC
Bilirubin Urine: NEGATIVE
GLUCOSE, UA: NEGATIVE mg/dL
Hgb urine dipstick: NEGATIVE
KETONES UR: NEGATIVE mg/dL
Nitrite: NEGATIVE
Protein, ur: NEGATIVE mg/dL
Specific Gravity, Urine: 1.019 (ref 1.005–1.030)
Urobilinogen, UA: 1 mg/dL (ref 0.0–1.0)
pH: 6 (ref 5.0–8.0)

## 2014-02-12 LAB — COMPREHENSIVE METABOLIC PANEL
ALK PHOS: 92 U/L (ref 39–117)
ALT: 11 U/L (ref 0–35)
AST: 16 U/L (ref 0–37)
Albumin: 3 g/dL — ABNORMAL LOW (ref 3.5–5.2)
BUN: 17 mg/dL (ref 6–23)
CALCIUM: 8.3 mg/dL — AB (ref 8.4–10.5)
CO2: 29 mEq/L (ref 19–32)
Chloride: 104 mEq/L (ref 96–112)
Creatinine, Ser: 1.06 mg/dL (ref 0.50–1.10)
GFR calc Af Amer: 53 mL/min — ABNORMAL LOW (ref >90)
GFR calc non Af Amer: 46 mL/min — ABNORMAL LOW (ref >90)
Glucose, Bld: 104 mg/dL — ABNORMAL HIGH (ref 70–99)
Potassium: 3.4 mEq/L — ABNORMAL LOW (ref 3.7–5.3)
SODIUM: 143 meq/L (ref 137–147)
TOTAL PROTEIN: 5.6 g/dL — AB (ref 6.0–8.3)
Total Bilirubin: 1 mg/dL (ref 0.3–1.2)

## 2014-02-12 LAB — HEMOGLOBIN A1C
Hgb A1c MFr Bld: 6 % — ABNORMAL HIGH (ref <5.7)
MEAN PLASMA GLUCOSE: 126 mg/dL — AB (ref <117)

## 2014-02-12 LAB — CBC
HEMATOCRIT: 36.9 % (ref 36.0–46.0)
HEMOGLOBIN: 12.1 g/dL (ref 12.0–15.0)
MCH: 30.5 pg (ref 26.0–34.0)
MCHC: 32.8 g/dL (ref 30.0–36.0)
MCV: 92.9 fL (ref 78.0–100.0)
Platelets: 193 10*3/uL (ref 150–400)
RBC: 3.97 MIL/uL (ref 3.87–5.11)
RDW: 13.5 % (ref 11.5–15.5)
WBC: 5.8 10*3/uL (ref 4.0–10.5)

## 2014-02-12 LAB — URINE MICROSCOPIC-ADD ON

## 2014-02-12 LAB — PHOSPHORUS: PHOSPHORUS: 3.8 mg/dL (ref 2.3–4.6)

## 2014-02-12 LAB — TROPONIN I
Troponin I: <0.3 ng/mL (ref <0.30)
Troponin I: <0.3 ng/mL (ref <0.30)

## 2014-02-12 LAB — MAGNESIUM: MAGNESIUM: 1.6 mg/dL (ref 1.5–2.5)

## 2014-02-12 LAB — TSH: TSH: 0.652 u[IU]/mL (ref 0.350–4.500)

## 2014-02-12 MED ORDER — ENOXAPARIN SODIUM 40 MG/0.4ML ~~LOC~~ SOLN
40.0000 mg | SUBCUTANEOUS | Status: DC
  Administered 2014-02-12 – 2014-02-15 (×4): 40 mg via SUBCUTANEOUS
  Filled 2014-02-12 (×4): qty 0.4

## 2014-02-12 MED ORDER — SODIUM CHLORIDE 0.9 % IV SOLN
INTRAVENOUS | Status: AC
  Administered 2014-02-12: 05:00:00 via INTRAVENOUS

## 2014-02-12 MED ORDER — ASPIRIN EC 81 MG PO TBEC
81.0000 mg | DELAYED_RELEASE_TABLET | Freq: Every day | ORAL | Status: DC
  Administered 2014-02-12 – 2014-02-15 (×4): 81 mg via ORAL
  Filled 2014-02-12 (×4): qty 1

## 2014-02-12 MED ORDER — DOCUSATE SODIUM 100 MG PO CAPS
100.0000 mg | ORAL_CAPSULE | Freq: Two times a day (BID) | ORAL | Status: DC
  Administered 2014-02-12 – 2014-02-15 (×6): 100 mg via ORAL
  Filled 2014-02-12 (×8): qty 1

## 2014-02-12 MED ORDER — FELODIPINE ER 5 MG PO TB24
5.0000 mg | ORAL_TABLET | Freq: Every day | ORAL | Status: DC
  Administered 2014-02-12 – 2014-02-15 (×4): 5 mg via ORAL
  Filled 2014-02-12 (×4): qty 1

## 2014-02-12 MED ORDER — ROPINIROLE HCL 0.25 MG PO TABS
0.1250 mg | ORAL_TABLET | Freq: Every day | ORAL | Status: DC
  Administered 2014-02-12 – 2014-02-15 (×4): 0.125 mg via ORAL
  Filled 2014-02-12 (×4): qty 0.5

## 2014-02-12 MED ORDER — ONDANSETRON HCL 4 MG PO TABS: 4.0000 mg | ORAL_TABLET | Freq: Four times a day (QID) | ORAL | Status: DC | PRN

## 2014-02-12 MED ORDER — ROPINIROLE HCL 0.25 MG PO TABS
0.2500 mg | ORAL_TABLET | Freq: Every day | ORAL | Status: DC
  Administered 2014-02-12 – 2014-02-15 (×3): 0.25 mg via ORAL
  Filled 2014-02-12 (×4): qty 1

## 2014-02-12 MED ORDER — DIAZEPAM 5 MG PO TABS
2.5000 mg | ORAL_TABLET | Freq: Two times a day (BID) | ORAL | Status: DC
  Administered 2014-02-12 – 2014-02-15 (×7): 2.5 mg via ORAL
  Filled 2014-02-12 (×7): qty 1

## 2014-02-12 MED ORDER — SODIUM CHLORIDE 0.9 % IJ SOLN
3.0000 mL | Freq: Two times a day (BID) | INTRAMUSCULAR | Status: DC
  Administered 2014-02-12 – 2014-02-15 (×7): 3 mL via INTRAVENOUS

## 2014-02-12 MED ORDER — TECHNETIUM TC 99M DIETHYLENETRIAME-PENTAACETIC ACID: 40.0000 | Freq: Once | INTRAVENOUS | Status: AC | PRN

## 2014-02-12 MED ORDER — ACETAMINOPHEN 650 MG RE SUPP: 650.0000 mg | Freq: Four times a day (QID) | RECTAL | Status: DC | PRN

## 2014-02-12 MED ORDER — GABAPENTIN 300 MG PO CAPS
300.0000 mg | ORAL_CAPSULE | Freq: Two times a day (BID) | ORAL | Status: DC
  Administered 2014-02-12 – 2014-02-15 (×7): 300 mg via ORAL
  Filled 2014-02-12 (×8): qty 1

## 2014-02-12 MED ORDER — ATORVASTATIN CALCIUM 40 MG PO TABS
40.0000 mg | ORAL_TABLET | Freq: Every day | ORAL | Status: DC
  Administered 2014-02-12 – 2014-02-15 (×4): 40 mg via ORAL
  Filled 2014-02-12 (×4): qty 1

## 2014-02-12 MED ORDER — POTASSIUM CHLORIDE CRYS ER 20 MEQ PO TBCR
20.0000 meq | EXTENDED_RELEASE_TABLET | Freq: Once | ORAL | Status: AC
Start: 2014-02-12 — End: 2014-02-12
  Administered 2014-02-12: 20 meq via ORAL
  Filled 2014-02-12: qty 1

## 2014-02-12 MED ORDER — ONDANSETRON HCL 4 MG/2ML IJ SOLN
4.0000 mg | Freq: Four times a day (QID) | INTRAMUSCULAR | Status: DC | PRN
  Administered 2014-02-14: 4 mg via INTRAVENOUS
  Filled 2014-02-12: qty 2

## 2014-02-12 MED ORDER — TECHNETIUM TO 99M ALBUMIN AGGREGATED
5.0000 | Freq: Once | INTRAVENOUS | Status: AC | PRN
  Administered 2014-02-12: 5 via INTRAVENOUS

## 2014-02-12 MED ORDER — ROPINIROLE HCL 0.25 MG PO TABS: 0.1250 mg | ORAL_TABLET | Freq: Two times a day (BID) | ORAL | Status: DC

## 2014-02-12 MED ORDER — ACETAMINOPHEN 325 MG PO TABS: 650.0000 mg | ORAL_TABLET | Freq: Four times a day (QID) | ORAL | Status: DC | PRN

## 2014-02-12 MED ORDER — HYDROCODONE-ACETAMINOPHEN 7.5-325 MG PO TABS
1.0000 | ORAL_TABLET | Freq: Four times a day (QID) | ORAL | Status: DC | PRN
  Administered 2014-02-12 – 2014-02-15 (×6): 1 via ORAL
  Filled 2014-02-12 (×6): qty 1

## 2014-02-12 MED ORDER — MECLIZINE HCL 25 MG PO TABS
25.0000 mg | ORAL_TABLET | Freq: Three times a day (TID) | ORAL | Status: DC | PRN
  Administered 2014-02-14: 25 mg via ORAL
  Filled 2014-02-12 (×2): qty 1

## 2014-02-12 MED ORDER — CITALOPRAM HYDROBROMIDE 10 MG PO TABS
10.0000 mg | ORAL_TABLET | Freq: Every day | ORAL | Status: DC
  Administered 2014-02-12 – 2014-02-15 (×4): 10 mg via ORAL
  Filled 2014-02-12 (×4): qty 1

## 2014-02-12 MED ORDER — ALBUTEROL SULFATE (2.5 MG/3ML) 0.083% IN NEBU: 2.5000 mg | INHALATION_SOLUTION | RESPIRATORY_TRACT | Status: DC | PRN

## 2014-02-12 NOTE — Progress Notes (Signed)
Patient seen and evaluated earlier this AM by my associate. Please refer to her H and P for details regarding assessment and plan.  We will further evaluation next am.  Kayla Ramos Kayla Ramos  Serum creatinine improved after IVF rehydration.  Should v/q scan come back inconclusive I would plan on obtaining CT angiogram of chest.  Patient saturating 97-100% on supplemental oxygen (2L) via nasal canula. VSS

## 2014-02-12 NOTE — H&P (Signed)
PCP:  RECORD,CHARLES    Chief Complaint:  Fall of the commode   HPI: Kayla Ramos is a 80 y.o. female   has a past medical history of Hypertension; Vertigo; CAD (coronary artery disease) (2000); Hyperlipidemia; Scoliosis; Vertebral artery stenosis (2006); Pulmonary embolism (03/02/2013); Exertional shortness of breath; Daily headache; GERD (gastroesophageal reflux disease); Arthritis; TIA (transient ischemic attack) (07/2011); Chronic lower back pain; Anxiety; and Parkinson disease.   Presented with  Patient has history of combined systolic and diastolic heart failure, Parkinson disease, and history of pulmonary embolism about one year ago family states that her Coumadin has been discontinued this she has finished course. Per family patient has been recently falling more often and has grown generally weak. Today she had an episode of this in a totally 12 try to sit down and sliding onto the floor. Given the fact that patient had had decreased by mouth intake and generalized fatigue she was brought in to emerge department by her family. Patient stayed home with 24-hour care. For her daughter-in-law endorses recent the starting her on fluid pill as she noted some fluid around her ankles.  The patient was brought in to emergency department she was found to be hypoxic on room air down to 80% which is not her baseline. Chest x-ray did not show any evidence of infiltrate. Given history of pulmonary embolism CT angiogram of the chest was considered. But given her acute on chronic renal failure with creatinine elevation of 1.27 patient thought to be not a candidate for contrast. Hospitalist was called to admit for VQ scan in AM and further workup Of note (patient has history of bioprosthetic aortic valve replacement per notes  Review of Systems:     Pertinent positives include: Fatigue, recurrent falls decreased appetite Constitutional:  No weight loss, night sweats, Fevers, chills, , weight loss  HEENT:   No headaches, Difficulty swallowing,Tooth/dental problems,Sore throat,  No sneezing, itching, ear ache, nasal congestion, post nasal drip,  Cardio-vascular:  No chest pain, Orthopnea, PND, anasarca, dizziness, palpitations.no Bilateral lower extremity swelling  GI:  No heartburn, indigestion, abdominal pain, nausea, vomiting, diarrhea, change in bowel habits, loss of appetite, melena, blood in stool, hematemesis Resp:  no shortness of breath at rest. No dyspnea on exertion, No excess mucus, no productive cough, No non-productive cough, No coughing up of blood.No change in color of mucus.No wheezing. Skin:  no rash or lesions. No jaundice GU:  no dysuria, change in color of urine, no urgency or frequency. No straining to urinate.  No flank pain.  Musculoskeletal:  No joint pain or no joint swelling. No decreased range of motion. No back pain.  Psych:  No change in mood or affect. No depression or anxiety. No memory loss.  Neuro: no localizing neurological complaints, no tingling, no weakness, no double vision, no gait abnormality, no slurred speech, no confusion  Otherwise ROS are negative except for above, 10 systems were reviewed  Past Medical History: Past Medical History  Diagnosis Date  . Hypertension   . Vertigo     "it's gotten better" (03/02/2013)  . CAD (coronary artery disease) 2000    s/p CABG   . Hyperlipidemia   . Scoliosis   . Vertebral artery stenosis 2006    75-90% per MR/MRA in 2006   . Pulmonary embolism 03/02/2013    "bilaterally; small" (03/02/2013)  . Exertional shortness of breath     "recently" (03/02/2013)  . Daily headache     "sometimes" (03/02/2013)  . GERD (  gastroesophageal reflux disease)     "used to be bad; not so anymore" (03/02/2013)  . Arthritis     "in her back" (03/02/2013)  . TIA (transient ischemic attack) 07/2011  . Chronic lower back pain   . Anxiety   . Parkinson disease     "dx'd just 2 wk ago" (09/07/2013)   Past Surgical History   Procedure Laterality Date  . Aortic valve replacement  2000    "pig valve" (03/02/2013)  . Cholecystectomy  2000  . Vaginal hysterectomy  ~ 1987  . Coronary artery bypass graft  2000    "CABG X?" (03/02/2013)  . Cataract extraction w/ intraocular lens  implant, bilateral Bilateral 2005  . Cardiac valve replacement    . Coronary angioplasty  1989     Medications: Prior to Admission medications   Medication Sig Start Date End Date Taking? Authorizing Provider  acetaminophen (TYLENOL) 325 MG tablet Take 650 mg by mouth every 6 (six) hours as needed for mild pain or headache.    Yes Historical Provider, MD  aspirin EC 81 MG tablet Take 81 mg by mouth daily.   Yes Historical Provider, MD  atorvastatin (LIPITOR) 40 MG tablet Take 40 mg by mouth daily.     Yes Historical Provider, MD  citalopram (CELEXA) 10 MG tablet Take 10 mg by mouth daily.   Yes Historical Provider, MD  diazepam (VALIUM) 5 MG tablet Take 2.5 mg by mouth 2 (two) times daily. Take 1/2 tablet every morning and 1/2 tablet every evening at 5 pm.   Yes Historical Provider, MD  felodipine (PLENDIL) 5 MG 24 hr tablet Take 5 mg by mouth daily.     Yes Historical Provider, MD  gabapentin (NEURONTIN) 300 MG capsule Take 300 mg by mouth 2 (two) times daily. Take one capsule in the morning and 1 capsule at bedtime.   Yes Historical Provider, MD  hydrochlorothiazide (MICROZIDE) 12.5 MG capsule Take 1 capsule by mouth every morning. 01/14/14  Yes Historical Provider, MD  HYDROcodone-acetaminophen (NORCO) 7.5-325 MG per tablet Take 1 tablet by mouth every 6 (six) hours as needed for moderate pain.   Yes Historical Provider, MD  lisinopril (PRINIVIL,ZESTRIL) 10 MG tablet Take 10 mg by mouth daily.   Yes Historical Provider, MD  meclizine (ANTIVERT) 25 MG tablet Take 25 mg by mouth 3 (three) times daily as needed for dizziness.   Yes Historical Provider, MD  rOPINIRole (REQUIP) 0.25 MG tablet Take 0.125-0.25 mg by mouth 2 (two) times daily.  Take 1/2 tablet every morning and 1 whole tablet in the evening at 5 pm. 10/13/13  Yes Alba CoryBelkys A Regalado, MD    Allergies:   Allergies  Allergen Reactions  . Carbidopa-Levodopa Rash    Social History:  Ambulatory walker Lives at  Home with 24 /7 home care   reports that she has never smoked. She has never used smokeless tobacco. She reports that she does not drink alcohol or use illicit drugs.   Family History: family history includes CAD in her father; Diabetes type II in her other; Hypertension in her mother.    Physical Exam: Patient Vitals for the past 24 hrs:  BP Temp Temp src Pulse Resp SpO2  02/12/14 0121 141/62 mmHg - - 60 16 96 %  02/12/14 0025 141/69 mmHg - - 63 16 96 %  02/11/14 1934 98/55 mmHg 98.2 F (36.8 C) Oral 76 23 90 %  02/11/14 1927 - - - - - 100 %    1.  General:  in No Acute distress 2. Psychological: Slightly lethargic unclear if  oriented 3. Head/ENT:    Dry Mucous Membranes                          Head Non traumatic, neck supple                          Poor Dentition 4. SKIN: decreased Skin turgor,  Skin clean Dry and intact no rash 5. Heart: Regular rate and rhythm no Murmur, Rub or gallop 6. Lungs: Clear to auscultation bilaterally, no wheezes or crackles   7. Abdomen: Soft, non-tender, Non distended 8. Lower extremities: no clubbing, cyanosis, or edema 9. Neurologically Grossly intact, moving all 4 extremities equally pill-rolling tremor noted 10. MSK: Normal range of motion  body mass index is unknown because there is no weight on file.   Labs on Admission:   Recent Labs  02/11/14 2145  NA 142  K 3.4*  CL 101  CO2 27  GLUCOSE 183*  BUN 17  CREATININE 1.27*  CALCIUM 8.6   No results found for this basename: AST, ALT, ALKPHOS, BILITOT, PROT, ALBUMIN,  in the last 72 hours No results found for this basename: LIPASE, AMYLASE,  in the last 72 hours  Recent Labs  02/11/14 2145  WBC 9.3  NEUTROABS 7.5  HGB 13.0  HCT 38.9   MCV 92.6  PLT 219   No results found for this basename: CKTOTAL, CKMB, CKMBINDEX, TROPONINI,  in the last 72 hours No results found for this basename: TSH, T4TOTAL, FREET3, T3FREE, THYROIDAB,  in the last 72 hours No results found for this basename: VITAMINB12, FOLATE, FERRITIN, TIBC, IRON, RETICCTPCT,  in the last 72 hours Lab Results  Component Value Date   HGBA1C 5.9* 10/12/2013    The CrCl is unknown because both a height and weight (above a minimum accepted value) are required for this calculation. ABG No results found for this basename: phart, pco2, po2, hco3, tco2, acidbasedef, o2sat     Lab Results  Component Value Date   DDIMER 1.29* 03/02/2013     Other results:  I have pearsonaly reviewed this: ECG REPORT  Rate: 66  Rhythm: Sinus rhythm ST&T Change: Diffuse flipped T-wave in inferior and lateral leads no significant change from EKG last year   Cultures:    Component Value Date/Time   SDES URINE, CLEAN CATCH 09/17/2013 1912   SPECREQUEST NONE 09/17/2013 1912   CULT  Value: Multiple bacterial morphotypes present, none predominant. Suggest appropriate recollection if clinically indicated. Performed at [facility] 09/17/2013 1912   REPTSTATUS 09/19/2013 FINAL 09/17/2013 1912       Radiological Exams on Admission: Dg Chest 1 View  02/11/2014   CLINICAL DATA:  Bilateral hip pain after a fall.  EXAM: CHEST - 1 VIEW  COMPARISON:  10/11/2013  FINDINGS: Postoperative changes in the mediastinum. Shallow inspiration. Mild Cardiac enlargement with normal pulmonary vascularity. Calcified and tortuous aorta. No focal airspace disease or pneumothorax.  IMPRESSION: No active disease.   Electronically Signed   By: Burman Nieves M.D.   On: 02/11/2014 23:42   Dg Hip Bilateral W/pelvis  02/11/2014   CLINICAL DATA:  Bilateral hip pain after a fall.  EXAM: BILATERAL HIP WITH PELVIS - 4+ VIEW  COMPARISON:  MRI lumbar spine 02/04/2009.  Abdomen 07/05/2005  FINDINGS: The  pelvis and hips appear intact. No displaced fractures are identified. Degenerative  changes in the lower lumbar spine and both hips. SI joints and symphysis pubis are not displaced.  IMPRESSION: No acute bony abnormalities demonstrated.   Electronically Signed   By: Burman Nieves M.D.   On: 02/11/2014 23:41    Chart has been reviewed  Assessment/Plan  80 year old female with history of combined systolic and diastolic heart failure with EF of 45-50%. She is with generalized weakness in the setting of acute on chronic renal failure  Due to dehydration, and evidence of hypoxia  Present on Admission:   . Hypoxia - will obtain VQ scan and a morning given history of PE. Also repeat chest x-ray after patient is somewhat dehydrated  . Dehydration - administer IV fluids  . CKD (chronic kidney disease), stage III - acute on chronic kidney failure repeat labs after administration of the fluid obtain urine electrolytes  . Hypertension - continue home medications  . CAD (coronary artery disease) - given generalized debility and history of coronary disease the cycle cardiac enzymes continue aspirin currently chest pain-free  . Weakness generalized - will have PT and OT evaluation.  . Chronic combined systolic and diastolic CHF, NYHA class 3 - obtain echogram to evaluate for worsening heart failure. Give gentle fluids as patient appears to be dehydrated we'll avoid fluid overload . Parkinson's disease does not appear to be optimally controlled. Patient would benefit from neurology followup  Prophylaxis:  Lovenox, Protonix  CODE STATUS: Full code as per patient's wishes  Other plan as per orders.  I have spent a total of 55 min on this admission  Symphonie Schneiderman 02/12/2014, 3:10 AM

## 2014-02-12 NOTE — Progress Notes (Signed)
Echocardiogram 2D Echocardiogram has been performed.  Kayla GrumblesMelissa J Kacyn Ramos 02/12/2014, 3:43 PM

## 2014-02-12 NOTE — Evaluation (Signed)
Physical Therapy Evaluation Patient Details Name: Kayla Ramos MRN: [id number] DOB: [birth year] Today's Date: 02/12/2014   History of Present Illness  Patient has history of combined systolic and diastolic heart failure, Parkinson disease, and history of pulmonary embolism about one year ago family states that her Coumadin has been discontinued this she has finished course. Per family patient has been recently falling more often and has grown generally weak. Today she had an episode of this in a totally 12 try to sit down and sliding onto the floor. Given the fact that patient had had decreased by mouth intake and generalized fatigue she was brought in to emerge department by her family.   Clinical Impression  Pt presents requiring mod-max A with mobility, transfers and gait, will benefit from skilled acute PT services to address deficits and increase functional independence.  PT recommends SNF for further strengthening before return home as pt reports she has had multiple falls at home despite 24 hour caregivers.  Pt states she is agreeable to SNF.    Follow Up Recommendations SNF    Equipment Recommendations  None recommended by PT    Recommendations for Other Services       Precautions / Restrictions Precautions Precautions: Fall Restrictions Weight Bearing Restrictions: No      Mobility  Bed Mobility Overal bed mobility: Needs Assistance Bed Mobility: Supine to Sit;Sit to Supine     Supine to sit: Mod assist Sit to supine: Mod assist   General bed mobility comments: assist for B LEs and trunk  Transfers Overall transfer level: Needs assistance   Transfers: Sit to/from Stand;Lateral/Scoot Transfers Sit to Stand: Max assist        Lateral/Scoot Transfers: Max assist;Total assist General transfer comment: pt requries max A to stand using RW, pt wtih severe posterior lean, difficulty correcting desptie manual facilitation, eventually able to center self over feet and RW  with max A.  scooting edge of bed requreis total A, pt unable to use UEs and LEs to push up  Ambulation/Gait Ambulation/Gait assistance: Max assist Ambulation Distance (Feet): 3 Feet Assistive device: Rolling walker (2 wheeled)       General Gait Details: pt with posterior lean requiring max A for balance  Stairs            Wheelchair Mobility    Modified Rankin (Stroke Patients Only)       Balance                                             Pertinent Vitals/Pain No c/o pain    Home Living Family/patient expects to be discharged to:: Private residence Living Arrangements: Alone Available Help at Discharge: Personal care attendant;Available 24 hours/day Type of Home: House Home Access: Stairs to enter Entrance Stairs-Rails: Can reach both Entrance Stairs-Number of Steps: 5-6 Home Layout: One level Home Equipment: Bedside commode;Grab bars - tub/shower;Toilet riser;Walker - 4 wheels;Shower seat;Hospital bed;Wheelchair - manual      Prior Function Level of Independence: Needs assistance   Gait / Transfers Assistance Needed: Ambulated to bathroom and back with RW but always had attendance, help to stand up off commode and get OOB     Comments: pt sleeps in hospital bed     Hand Dominance        Extremity/Trunk Assessment   Upper Extremity Assessment: Generalized weakness  Lower Extremity Assessment: Generalized weakness      Cervical / Trunk Assessment: Kyphotic  Communication      Cognition Arousal/Alertness: Awake/alert Behavior During Therapy: WFL for tasks assessed/performed Overall Cognitive Status: Within Functional Limits for tasks assessed                      General Comments      Exercises        Assessment/Plan    PT Assessment Patient needs continued PT services  PT Diagnosis Difficulty walking;Generalized weakness   PT Problem List Decreased activity tolerance;Decreased  balance;Decreased mobility;Decreased strength;Decreased knowledge of use of DME  PT Treatment Interventions Gait training;DME instruction;Balance training;Stair training;Modalities;Neuromuscular re-education;Patient/family education;Functional mobility training;Therapeutic activities;Wheelchair mobility training;Therapeutic exercise   PT Goals (Current goals can be found in the Care Plan section) Acute Rehab PT Goals Patient Stated Goal: none stated PT Goal Formulation: With patient Time For Goal Achievement: 02/19/14 Potential to Achieve Goals: Good    Frequency Min 3X/week   Barriers to discharge Inaccessible home environment;Decreased caregiver support 5-6 steps, says she has friends assist her (not professional care givers)    Co-evaluation               End of Session Equipment Utilized During Treatment: Gait belt Activity Tolerance: Patient limited by fatigue Patient left: in bed;with call bell/phone within reach;with nursing/sitter in room Nurse Communication: Mobility status         Time: [id number] PT Time Calculation (min): 23 min   Charges:   PT Evaluation $Initial PT Evaluation Tier I: 1 Procedure PT Treatments $Therapeutic Activity: 8-22 mins   PT G Codes:          Leone BrandKaren K Donawerth 02/12/2014, 12:14 PM

## 2014-02-13 ENCOUNTER — Other Ambulatory Visit: Payer: Self-pay

## 2014-02-13 DIAGNOSIS — R51 Headache: Secondary | ICD-10-CM

## 2014-02-13 DIAGNOSIS — I509 Heart failure, unspecified: Secondary | ICD-10-CM

## 2014-02-13 DIAGNOSIS — I5042 Chronic combined systolic (congestive) and diastolic (congestive) heart failure: Secondary | ICD-10-CM

## 2014-02-13 MED ORDER — LISINOPRIL 10 MG PO TABS
10.0000 mg | ORAL_TABLET | Freq: Every day | ORAL | Status: DC
  Administered 2014-02-13 – 2014-02-15 (×3): 10 mg via ORAL
  Filled 2014-02-13 (×3): qty 1

## 2014-02-13 NOTE — Progress Notes (Signed)
[facility] PROGRESS NOTE  Kayla Ramos WJX:[id number] DOB: April 15, 1925 DOA: 02/11/2014 PCP: RECORD,CHARLES  Assessment/Plan:  . Hypoxia -improved, wean off O2 -transient, sats 96% on 1L, wean O2 -CXr clear, no evidence of heart feailure -VQ scan negative  . Dehydration -improved with IVF, stop fluids now  . CKD (chronic kidney disease), stage III  -improved with rehydration  . Hypertension -  -resume ACE, continue plendil  . CAD (coronary artery disease)  -stable, continue ASA, enzymes negative  . Weakness generalized  - due to age, Parkinson's  - will have PT and OT evaluation.   . Chronic combined systolic and diastolic CHF, NYHA class 3 -compensated, off IVF, ECHO with normal EF and grade 1 DD -HCTZ on hold  . Parkinson's disease -FU with Neurology  Prophylaxis: Lovenox, Protonix   CODE STATUS: Full code as per patient's wishes Communication: none at bedside Dispo: Home with 24X7 caregivers vs SNF  HPI/Subjective: Feels ok, no complaints  Objective: Filed Vitals:   02/13/14 0600  BP: 165/91  Pulse: 59  Temp: 97.9 F (36.6 C)  Resp: 18    Intake/Output Summary (Last 24 hours) at 02/13/14 1019 Last data filed at 02/13/14 0900  Gross per 24 hour  Intake   1130 ml  Output      0 ml  Net   1130 ml   Filed Weights   02/12/14 0421 02/13/14 0600  Weight: 62.7 kg (138 lb 3.7 oz) 60.6 kg (133 lb 9.6 oz)    Exam:   General:  AAox3, no distress  Cardiovascular:S1S2/RRR  Respiratory: CTAB  Abdomen:soft, Nt, BS present  Musculoskeletal: no edema c/c  Neuro: tremors   Data Reviewed: Basic Metabolic Panel:  Recent Labs Lab 02/11/14 2145 02/12/14 0519  NA 142 143  K 3.4* 3.4*  CL 101 104  CO2 27 29  GLUCOSE 183* 104*  BUN 17 17  CREATININE 1.27* 1.06  CALCIUM 8.6 8.3*  MG  --  1.6  PHOS  --  3.8   Liver Function Tests:  Recent Labs Lab 02/12/14 0519  AST 16  ALT 11  ALKPHOS 92  BILITOT 1.0  PROT 5.6*  ALBUMIN 3.0*    No results found for this basename: LIPASE, AMYLASE,  in the last 168 hours No results found for this basename: AMMONIA,  in the last 168 hours CBC:  Recent Labs Lab 02/11/14 2145 02/12/14 0519  WBC 9.3 5.8  NEUTROABS 7.5  --   HGB 13.0 12.1  HCT 38.9 36.9  MCV 92.6 92.9  PLT 219 193   Cardiac Enzymes:  Recent Labs Lab 02/12/14 0519 02/12/14 1128 02/12/14 1608  TROPONINI <0.30 <0.30 <0.30   BNP (last 3 results)  Recent Labs  09/07/13 0742 09/17/13 1754 02/11/14 2148  PROBNP 1089.0* 675.2* 1527.0*   CBG: No results found for this basename: GLUCAP,  in the last 168 hours  No results found for this or any previous visit (from the past 240 hour(s)).   Studies: Dg Chest 1 View  02/11/2014   CLINICAL DATA:  Bilateral hip pain after a fall.  EXAM: CHEST - 1 VIEW  COMPARISON:  10/11/2013  FINDINGS: Postoperative changes in the mediastinum. Shallow inspiration. Mild Cardiac enlargement with normal pulmonary vascularity. Calcified and tortuous aorta. No focal airspace disease or pneumothorax.  IMPRESSION: No active disease.   Electronically Signed   By: Burman Nieves M.D.   On: 02/11/2014 23:42   X-ray Chest Pa And Lateral   02/12/2014   CLINICAL DATA:  Hypoxia.  EXAM: CHEST  2 VIEW  COMPARISON:  02/11/2014  FINDINGS: Cardiac silhouette is mildly enlarged. There are stable changes from prior CABG surgery. The mediastinal or hilar masses.  No acute findings in the lungs. No pleural effusion. No pneumothorax.  Bony thorax is diffusely demineralized but grossly intact.  IMPRESSION: No acute cardiopulmonary disease. No change from the previous study.   Electronically Signed   By: Amie Portland M.D.   On: 02/12/2014 11:18   Dg Hip Bilateral W/pelvis  02/11/2014   CLINICAL DATA:  Bilateral hip pain after a fall.  EXAM: BILATERAL HIP WITH PELVIS - 4+ VIEW  COMPARISON:  MRI lumbar spine 02/04/2009.  Abdomen 07/05/2005  FINDINGS: The pelvis and hips appear intact. No displaced  fractures are identified. Degenerative changes in the lower lumbar spine and both hips. SI joints and symphysis pubis are not displaced.  IMPRESSION: No acute bony abnormalities demonstrated.   Electronically Signed   By: Burman Nieves M.D.   On: 02/11/2014 23:41   Nm Pulmonary Perf And Vent  02/12/2014   CLINICAL DATA:  Hypoxia, history of pulmonary embolism  EXAM: NUCLEAR MEDICINE VENTILATION - PERFUSION LUNG SCAN  TECHNIQUE: Ventilation images were obtained in multiple projections using inhaled aerosol technetium 99 M DTPA. Perfusion images were obtained in multiple projections after intravenous injection of Tc-3m MAA.  RADIOPHARMACEUTICALS:  40 mCi Tc-48m DTPA aerosol and 5 mCi Tc-57m MAA  COMPARISON:  DG CHEST 1 VIEW dated 02/11/2014  FINDINGS: Review of chest radiograph performed earlier same day demonstrates enlarged cardiac silhouette and mediastinal contours. There is mild elevation of the right hemidiaphragm with associated mild right-sided volume loss. Mild pulmonary venous congestion without evidence of edema. Trace bilateral effusions are not excluded. Moderate scoliotic curvature of the thoracolumbar spine with dominant curvature convex to the left.  Ventilation: There is mild undulation of the bilateral pulmonary parenchyma, left greater than right. There is retention of inhaled radiotracer within the trachea and left mainstem bronchus. Ingested radiotracer is seen within the hypopharynx and superior esophagus.  Perfusion: There is relative homogeneous distribution of injected radiotracer throughout the bilateral pulmonary parenchyma. Relative area of oligemia involving the medial aspect of the right mid lung is associated with a stripe sign. No discrete segmental or subsegmental mismatched filling defect to suggest pulmonary embolism.  IMPRESSION: Pulmonary embolism absent (low probability for pulmonary embolism).   Electronically Signed   By: Simonne Come M.D.   On: 02/12/2014 10:17     Scheduled Meds: . aspirin EC  81 mg Oral Daily  . atorvastatin  40 mg Oral Daily  . citalopram  10 mg Oral Daily  . diazepam  2.5 mg Oral BID  . docusate sodium  100 mg Oral BID  . enoxaparin (LOVENOX) injection  40 mg Subcutaneous Q24H  . felodipine  5 mg Oral Daily  . gabapentin  300 mg Oral BID  . lisinopril  10 mg Oral Daily  . rOPINIRole  0.125 mg Oral Daily  . rOPINIRole  0.25 mg Oral Q supper  . sodium chloride  3 mL Intravenous Q12H   Continuous Infusions:  Antibiotics Given (last 72 hours)   None      Active Problems:   CAD (coronary artery disease)   Hypertension   Weakness generalized   Parkinson's disease   CKD (chronic kidney disease), stage III   Hypoxia   Dehydration   Chronic combined systolic and diastolic CHF, NYHA class 3    Time spent:35min   Zannie Cove  [facility] Pager [phone]. If 7PM-7AM, please contact night-coverage at [email or web address], password [email or web address] 02/13/2014, 10:19 AM  LOS: 2 days

## 2014-02-13 NOTE — ED Provider Notes (Signed)
Medical screening examination/treatment/procedure(s) were conducted as a shared visit with non-physician practitioner(s) and myself.  I personally evaluated the patient during the encounter.   EKG Interpretation None        Date: 02/11/2014  Rate: 66  Rhythm: normal sinus rhythm  QRS Axis: normal  Intervals: normal  ST/T Wave abnormalities: nonspecific ST changes and t wave inversion noted  Conduction Disutrbances:none  Narrative Interpretation:   Old EKG Reviewed: unchanged    Joya Gaskinsonald W Doralee Kocak, MD 02/13/14 1441

## 2014-02-14 LAB — CBC
HCT: 36 % (ref 36.0–46.0)
Hemoglobin: 11.9 g/dL — ABNORMAL LOW (ref 12.0–15.0)
MCH: 30.6 pg (ref 26.0–34.0)
MCHC: 33.1 g/dL (ref 30.0–36.0)
MCV: 92.5 fL (ref 78.0–100.0)
PLATELETS: 185 10*3/uL (ref 150–400)
RBC: 3.89 MIL/uL (ref 3.87–5.11)
RDW: 13.4 % (ref 11.5–15.5)
WBC: 5.6 10*3/uL (ref 4.0–10.5)

## 2014-02-14 LAB — BASIC METABOLIC PANEL
BUN: 13 mg/dL (ref 6–23)
CALCIUM: 8.6 mg/dL (ref 8.4–10.5)
CHLORIDE: 103 meq/L (ref 96–112)
CO2: 28 mEq/L (ref 19–32)
Creatinine, Ser: 0.93 mg/dL (ref 0.50–1.10)
GFR calc Af Amer: 62 mL/min — ABNORMAL LOW (ref >90)
GFR calc non Af Amer: 54 mL/min — ABNORMAL LOW (ref >90)
Glucose, Bld: 90 mg/dL (ref 70–99)
Potassium: 3.8 mEq/L (ref 3.7–5.3)
Sodium: 140 mEq/L (ref 137–147)

## 2014-02-14 LAB — SODIUM, URINE, RANDOM: Sodium, Ur: 187 mEq/L

## 2014-02-14 LAB — CREATININE, URINE, RANDOM: CREATININE, URINE: 121.65 mg/dL

## 2014-02-14 MED ORDER — HYDROCHLOROTHIAZIDE 12.5 MG PO CAPS
12.5000 mg | ORAL_CAPSULE | Freq: Every day | ORAL | Status: DC
  Administered 2014-02-14 – 2014-02-15 (×2): 12.5 mg via ORAL
  Filled 2014-02-14 (×2): qty 1

## 2014-02-14 MED ORDER — PROMETHAZINE HCL 25 MG/ML IJ SOLN
12.5000 mg | Freq: Four times a day (QID) | INTRAMUSCULAR | Status: DC | PRN
  Administered 2014-02-14: 12.5 mg via INTRAVENOUS
  Filled 2014-02-14: qty 1

## 2014-02-14 NOTE — Progress Notes (Signed)
[facility] PROGRESS NOTE  Kayla Ramos UJW:[id number] DOB: January 28, 1925 DOA: 02/11/2014 PCP: RECORD,CHARLES  Assessment/Plan:  . Hypoxia -improved, weaned off O2 -CXr clear, no evidence of heart feailure -VQ scan negative  . Dehydration -improved with IVF, stopped IVF  . CKD (chronic kidney disease), stage III  -improved with rehydration  . Hypertension -  -resumed ACE, continue plendil  . CAD (coronary artery disease)  -stable, continue ASA, enzymes negative  . Weakness generalized  - due to age, Parkinson's  - will have PT and OT evaluation.   . Chronic combined systolic and diastolic CHF, NYHA class 3 -compensated, off IVF, ECHO with normal EF and grade 1 DD -resume HCTZ at 12.5mg  daily  . Parkinson's disease -FU with Neurology  Prophylaxis: Lovenox, Protonix   CODE STATUS: Full code as per patient's wishes Communication: none at bedside Dispo: SNF  HPI/Subjective: Feels ok, no complaints  Objective: Filed Vitals:   02/14/14 0524  BP: 159/78  Pulse: 59  Temp: 98 F (36.7 C)  Resp: 16    Intake/Output Summary (Last 24 hours) at 02/14/14 0835 Last data filed at 02/14/14 0716  Gross per 24 hour  Intake    480 ml  Output    150 ml  Net    330 ml   Filed Weights   02/12/14 0421 02/13/14 0600 02/14/14 0524  Weight: 62.7 kg (138 lb 3.7 oz) 60.6 kg (133 lb 9.6 oz) 61.6 kg (135 lb 12.9 oz)    Exam:   General:  AAox3, no distress  Cardiovascular:S1S2/RRR  Respiratory: CTAB  Abdomen:soft, Nt, BS present  Musculoskeletal: no edema c/c  Neuro: tremors   Data Reviewed: Basic Metabolic Panel:  Recent Labs Lab 02/11/14 2145 02/12/14 0519 02/14/14 0433  NA 142 143 140  K 3.4* 3.4* 3.8  CL 101 104 103  CO2 27 29 28   GLUCOSE 183* 104* 90  BUN 17 17 13   CREATININE 1.27* 1.06 0.93  CALCIUM 8.6 8.3* 8.6  MG  --  1.6  --   PHOS  --  3.8  --    Liver Function Tests:  Recent Labs Lab 02/12/14 0519  AST 16  ALT 11  ALKPHOS 92   BILITOT 1.0  PROT 5.6*  ALBUMIN 3.0*   No results found for this basename: LIPASE, AMYLASE,  in the last 168 hours No results found for this basename: AMMONIA,  in the last 168 hours CBC:  Recent Labs Lab 02/11/14 2145 02/12/14 0519 02/14/14 0433  WBC 9.3 5.8 5.6  NEUTROABS 7.5  --   --   HGB 13.0 12.1 11.9*  HCT 38.9 36.9 36.0  MCV 92.6 92.9 92.5  PLT 219 193 185   Cardiac Enzymes:  Recent Labs Lab 02/12/14 0519 02/12/14 1128 02/12/14 1608  TROPONINI <0.30 <0.30 <0.30   BNP (last 3 results)  Recent Labs  09/07/13 0742 09/17/13 1754 02/11/14 2148  PROBNP 1089.0* 675.2* 1527.0*   CBG: No results found for this basename: GLUCAP,  in the last 168 hours  No results found for this or any previous visit (from the past 240 hour(s)).   Studies: X-ray Chest Pa And Lateral   02/12/2014   CLINICAL DATA:  Hypoxia.  EXAM: CHEST  2 VIEW  COMPARISON:  02/11/2014  FINDINGS: Cardiac silhouette is mildly enlarged. There are stable changes from prior CABG surgery. The mediastinal or hilar masses.  No acute findings in the lungs. No pleural effusion. No pneumothorax.  Bony thorax is diffusely demineralized but grossly intact.  IMPRESSION: No acute cardiopulmonary disease. No change from the previous study.   Electronically Signed   By: Amie Portlandavid  Ormond M.D.   On: 02/12/2014 11:18   Nm Pulmonary Perf And Vent  02/12/2014   CLINICAL DATA:  Hypoxia, history of pulmonary embolism  EXAM: NUCLEAR MEDICINE VENTILATION - PERFUSION LUNG SCAN  TECHNIQUE: Ventilation images were obtained in multiple projections using inhaled aerosol technetium 99 M DTPA. Perfusion images were obtained in multiple projections after intravenous injection of Tc-7652m MAA.  RADIOPHARMACEUTICALS:  40 mCi Tc-3752m DTPA aerosol and 5 mCi Tc-2352m MAA  COMPARISON:  DG CHEST 1 VIEW dated 02/11/2014  FINDINGS: Review of chest radiograph performed earlier same day demonstrates enlarged cardiac silhouette and mediastinal contours.  There is mild elevation of the right hemidiaphragm with associated mild right-sided volume loss. Mild pulmonary venous congestion without evidence of edema. Trace bilateral effusions are not excluded. Moderate scoliotic curvature of the thoracolumbar spine with dominant curvature convex to the left.  Ventilation: There is mild undulation of the bilateral pulmonary parenchyma, left greater than right. There is retention of inhaled radiotracer within the trachea and left mainstem bronchus. Ingested radiotracer is seen within the hypopharynx and superior esophagus.  Perfusion: There is relative homogeneous distribution of injected radiotracer throughout the bilateral pulmonary parenchyma. Relative area of oligemia involving the medial aspect of the right mid lung is associated with a stripe sign. No discrete segmental or subsegmental mismatched filling defect to suggest pulmonary embolism.  IMPRESSION: Pulmonary embolism absent (low probability for pulmonary embolism).   Electronically Signed   By: Simonne ComeJohn  Watts M.D.   On: 02/12/2014 10:17    Scheduled Meds: . aspirin EC  81 mg Oral Daily  . atorvastatin  40 mg Oral Daily  . citalopram  10 mg Oral Daily  . diazepam  2.5 mg Oral BID  . docusate sodium  100 mg Oral BID  . enoxaparin (LOVENOX) injection  40 mg Subcutaneous Q24H  . felodipine  5 mg Oral Daily  . gabapentin  300 mg Oral BID  . hydrochlorothiazide  12.5 mg Oral Daily  . lisinopril  10 mg Oral Daily  . rOPINIRole  0.125 mg Oral Daily  . rOPINIRole  0.25 mg Oral Q supper  . sodium chloride  3 mL Intravenous Q12H   Continuous Infusions:  Antibiotics Given (last 72 hours)   None      Active Problems:   CAD (coronary artery disease)   Hypertension   Weakness generalized   Parkinson's disease   CKD (chronic kidney disease), stage III   Hypoxia   Dehydration   Chronic combined systolic and diastolic CHF, NYHA class 3    Time spent:830min   Zannie CovePreetha Ervin Hensley  [facility] Pager [phone]. If 7PM-7AM, please contact night-coverage at [email or web address], password [email or web address] 02/14/2014, 8:35 AM  LOS: 3 days

## 2014-02-14 NOTE — Progress Notes (Signed)
Physical Therapy Treatment Patient Details Name: Kayla MassMary L Beadle MRN: [id number] DOB: 12-14-1924 Today's Date: 02/14/2014    History of Present Illness Patient is a 80 year old admitted on 02/11/14 after a fall. She has history of combined systolic and diastolic heart failure, Parkinson disease, and history of pulmonary embolism. Per chart and family pt with recent falls and weakness .    PT Comments    Progressing slowly with mobility. Pt continues to fatigue easily with minimal activity and requires Mod-Max assist for mobility.   Follow Up Recommendations  SNF;Supervision/Assistance - 24 hour     Equipment Recommendations  None recommended by PT    Recommendations for Other Services OT consult     Precautions / Restrictions Precautions Precautions: Fall Restrictions Weight Bearing Restrictions: No    Mobility  Bed Mobility Overal bed mobility: Needs Assistance Bed Mobility: Supine to Sit     Supine to sit: Mod assist Sit to supine: Mod assist   General bed mobility comments: assist to bring trunk up and scoot hips around to EOB with use of pad.   Transfers Overall transfer level: Needs assistance Equipment used: Rolling walker (2 wheeled) Transfers: Sit to/from Stand Sit to Stand: Max assist        Lateral/Scoot Transfers: Max assist General transfer comment: Assist to rise, stabilize, control descent. Multimodal cues for saety, technique, hand placement  Ambulation/Gait Ambulation/Gait assistance: Mod assist Ambulation Distance (Feet): 10 Feet Assistive device: Rolling walker (2 wheeled) Gait Pattern/deviations: Trunk flexed;Decreased stride length;Decreased step length - left;Decreased step length - right;Shuffle     General Gait Details: Significantly flexed posture. Multimodal cues and faciliation to encourage trunk, neck, head extension. Fatigues easily. Followed closely with recliner.    Stairs            Wheelchair Mobility    Modified  Rankin (Stroke Patients Only)       Balance                                    Cognition Arousal/Alertness: Awake/alert Behavior During Therapy: WFL for tasks assessed/performed Overall Cognitive Status: Within Functional Limits for tasks assessed                      Exercises      General Comments General comments (skin integrity, edema, etc.): min guard assist sitting EOB for bathing.      Pertinent Vitals/Pain No c/o pain    Home Living Family/patient expects to be discharged to:: Private residence Living Arrangements: Alone Available Help at Discharge: Personal care attendant;Available 24 hours/day Type of Home: House Home Access: Stairs to enter Entrance Stairs-Rails: Can reach both Home Layout: One level Home Equipment: Bedside commode;Grab bars - tub/shower;Toilet riser;Walker - 4 wheels;Shower seat;Hospital bed;Wheelchair - manual      Prior Function Level of Independence: Needs assistance  Gait / Transfers Assistance Needed: Ambulated to bathroom and back with RW but always had attendance, help to stand up off commode and manage clothing and get OOB ADL's / Homemaking Assistance Needed: caregivers assisted with bathing/dressing. Pt could groom and feed herself. assist with clothing management. pt could do own hygiene on commode and transfer to toilet.  Comments: pt sleeps in hospital bed   PT Goals (current goals can now be found in the care plan section) Acute Rehab PT Goals Patient Stated Goal: family wishes for pt to walk more Progress towards PT  goals: Progressing toward goals (slowly)    Frequency  Min 3X/week    PT Plan Current plan remains appropriate    Co-evaluation             End of Session Equipment Utilized During Treatment: Gait belt Activity Tolerance: Patient limited by fatigue Patient left: in chair;with chair alarm set;with call bell/phone within reach;with family/visitor present     Time: 1610-96041137-1213 PT  Time Calculation (min): 36 min  Charges:  $Gait Training: 8-22 mins                    G Codes:      Rebeca AlertJannie Anaclara Acklin, MPT Pager: [phone]

## 2014-02-14 NOTE — Evaluation (Addendum)
Occupational Therapy Evaluation Patient Details Name: Kayla Ramos MRN: [id number] DOB: 01/26/1925 Today's Date: 02/14/2014    History of Present Illness Patient is a 80 year old admitted on 02/11/14 after a fall. She has history of combined systolic and diastolic heart failure, Parkinson disease, and history of pulmonary embolism. Per chart and family pt with recent falls and weakness .   Clinical Impression   Pt presents to OT with functional limitations listed below and will benefit from skilled OT services to maximize ADL independence for next venue of care.     Follow Up Recommendations  SNF;Supervision/Assistance - 24 hour    Equipment Recommendations  None recommended by OT    Recommendations for Other Services       Precautions / Restrictions Precautions Precautions: Fall Restrictions Weight Bearing Restrictions: No      Mobility Bed Mobility Overal bed mobility: Needs Assistance Bed Mobility: Supine to Sit     Supine to sit: Mod assist     General bed mobility comments: assist to bring trunk up and scoot hips around to EOB with use of pad.   Transfers Overall transfer level: Needs assistance Equipment used: Rolling walker (2 wheeled) Transfers: Sit to/from Stand Sit to Stand: Max assist;+2 physical assistance;+2 safety/equipment         General transfer comment: assist to rise and stabilize. Pt tends to be forward flexed but can stand straighter with verbal cues and tactile cues but then tends to return to more flexed posture.  Assist for controlling descent and verbal cues for hand placement.    Balance                                            ADL Overall ADL's : Needs assistance/impaired Eating/Feeding: Set up;Sitting   Grooming: Wash/dry hands;Set up;Sitting   Upper Body Bathing: Moderate assistance;Sitting   Lower Body Bathing: Total assistance;+2 for physical assistance;+2 for safety/equipment;Sit to/from stand   Upper  Body Dressing : Moderate assistance;Sitting   Lower Body Dressing: Total assistance;+2 for physical assistance;+2 for safety/equipment;Sit to/from stand   Toilet Transfer: +2 for physical assistance;+2 for safety/equipment;Maximal assistance;Stand-pivot   Toileting- Clothing Manipulation and Hygiene: Total assistance;+2 for physical assistance;+2 for safety/equipment;Sit to/from stand Toileting - Clothing Manipulation Details (indicate cue type and reason): pt not able to let go fo walker to help with hygiene or clothing management.     Functional mobility during ADLs: Rolling walker General ADL Comments: Pt with forward flexed posture in standing and needs cue to try to stand more erect and bring weight over feet. Pt unable to let go of walker to use UEs in functional ADL. She needs assist to steer walker especially with turns. She fatigues easily with tasks also. +2 for safety with one person assist with standing support and the 2nd person to manage clothing/wash periaeas, etc.      Vision                     Perception     Praxis      Pertinent Vitals/Pain 92% on RA after activity     Hand Dominance Right   Extremity/Trunk Assessment Upper Extremity Assessment Upper Extremity Assessment: Generalized weakness (UE tremors noted and family reports this is worse since admission.)           Communication Communication Communication: No difficulties   Cognition Arousal/Alertness:  Awake/alert Behavior During Therapy: WFL for tasks assessed/performed Overall Cognitive Status: Within Functional Limits for tasks assessed                     General Comments       Exercises       Shoulder Instructions      Home Living Family/patient expects to be discharged to:: Private residence Living Arrangements: Alone Available Help at Discharge: Personal care attendant;Available 24 hours/day Type of Home: House Home Access: Stairs to enter Enter[employer] CorporationEntrance Stairs-Number  of Steps: 5-6 Entrance Stairs-Rails: Can reach both Home Layout: One level     Bathroom Shower/Tub:  (sponge bath)   Bathroom Toilet: Standard     Home Equipment: Bedside commode;Grab bars - tub/shower;Toilet riser;Walker - 4 wheels;Shower seat;Hospital bed;Wheelchair - manual          Prior Functioning/Environment Level of Independence: Needs assistance  Gait / Transfers Assistance Needed: Ambulated to bathroom and back with RW but always had attendance, help to stand up off commode and manage clothing and get OOB ADL's / Homemaking Assistance Needed: caregivers assisted with bathing/dressing. Pt could groom and feed herself. assist with clothing management. pt could do own hygiene on commode and transfer to toilet.    Comments: pt sleeps in hospital bed    OT Diagnosis: Generalized weakness   OT Problem List: Decreased strength;Impaired balance (sitting and/or standing);Decreased activity tolerance;Decreased knowledge of use of DME or AE   OT Treatment/Interventions: Self-care/ADL training;Patient/family education;DME and/or AE instruction;Therapeutic activities    OT Goals(Current goals can be found in the care plan section) Acute Rehab OT Goals Patient Stated Goal: family wishes for pt to walk more OT Goal Formulation: With patient/family Time For Goal Achievement: 02/28/14 Potential to Achieve Goals: Good  OT Frequency: Min 2X/week   Barriers to D/C:            Co-evaluation              End of Session Equipment Utilized During Treatment: Gait belt;Rolling walker  Activity Tolerance: Patient tolerated treatment well Patient left: in chair;with call bell/phone within reach;with chair alarm set   Time: 1308-65781136-1212 OT Time Calculation (min): 36 min Charges:  OT General Charges $OT Visit: 1 Procedure OT Evaluation $Initial OT Evaluation Tier I: 1 Procedure OT Treatments $Self Care/Home Management : 8-22 mins G-Codes:    Sabino GasserStephanie Stafford  Keshav Winegar [phone] 02/14/2014, 1:11 PM

## 2014-02-14 NOTE — Clinical Social Work Psychosocial (Signed)
     Clinical Social Work Department BRIEF PSYCHOSOCIAL ASSESSMENT 02/14/2014  Patient:  Kayla Ramos, Kayla Ramos     Account Number:  [id number]     Admit date:  02/11/2014  Clinical Social Worker:  Venia Minks  Date/Time:  02/14/2014 12:00 M  Referred by:  Physician  Date Referred:  02/14/2014 Referred for  SNF Placement   Other Referral:   Interview type:  Patient Other interview type:   daughter at bedside.    PSYCHOSOCIAL DATA Living Status:  ALONE Admitted from facility:   Level of care:   Primary support name:  wendy dunning Primary support relationship to patient:  FAMILY Degree of support available:   good    CURRENT CONCERNS Current Concerns  Post-Acute Placement   Other Concerns:    SOCIAL WORK ASSESSMENT / PLAN CSW met with patient and patient's granddaughter at bedside. patient is alert and oriented X3.  Discussed need for snf placement. Patient reports that she was told she would likely need to go to rehab. She would like to go to [facility], if that is possible.   Assessment/plan status:   Other assessment/ plan:   Information/referral to community resources:    PATIENTS/FAMILYS RESPONSE TO PLAN OF CARE: patient is motivated to do short term rehab and be able to return home. she has supportive family.

## 2014-02-15 DIAGNOSIS — M549 Dorsalgia, unspecified: Secondary | ICD-10-CM

## 2014-02-15 DIAGNOSIS — G8929 Other chronic pain: Secondary | ICD-10-CM

## 2014-02-15 MED ORDER — HYDROCODONE-ACETAMINOPHEN 7.5-325 MG PO TABS: 1.0000 | ORAL_TABLET | Freq: Four times a day (QID) | ORAL | Status: DC | PRN

## 2014-02-15 MED ORDER — DIAZEPAM 5 MG PO TABS: 2.5000 mg | ORAL_TABLET | Freq: Two times a day (BID) | ORAL | Status: AC | PRN

## 2014-02-15 NOTE — Progress Notes (Signed)
Auth obtained. # [id number], RUG level: RVD. Patient cleared for discharge. Packet copied and placed in [place]. ptar called for transportation. CSW called patient's granddaughter, wendy, informed of discharge.  Zakkiyya Barno C. Trynity Skousen MSW, LCSW [phone]

## 2014-02-15 NOTE — Progress Notes (Signed)
Patient for probable DC to [facility] today. Submitted for blue medicare auth.  Ginamarie Banfield C. Farran Amsden MSW, LCSW [phone]

## 2014-02-15 NOTE — Discharge Summary (Addendum)
Physician Discharge Summary  Kayla Ramos ZOX:[id number] DOB: 1925/03/29 DOA: 02/11/2014  PCP: Kayla Ramos  Admit date: 02/11/2014 Discharge date: 02/15/2014  Time spent: 45 minutes  Recommendations for Outpatient Follow-up:  1. PCP in 1 week 2. Consider outpatient sleep study for Sleep Apnea, transient hypoxia while sleeping noted 3. She has wide fluctuations in BP due to Parkinson's disease/autonomic neuropathy  Discharge Diagnoses:    Transient Hypoxia   Fall   Dehydration   Parkinsons disease   CAD (coronary artery disease)   Hypertension   Weakness generalized   Parkinson's disease   CKD (chronic kidney disease), stage III   Hypoxia   Dehydration   Chronic combined systolic and diastolic CHF, NYHA class 3   Discharge Condition: stable  Diet recommendation:low sodium  Filed Weights   02/13/14 0600 02/14/14 0524 02/15/14 0500  Weight: 60.6 kg (133 lb 9.6 oz) 61.6 kg (135 lb 12.9 oz) 61.5 kg (135 lb 9.3 oz)    History of present illness:  Kayla Ramos is a 80 y.o. Female with PMH of HTN, CAD, Scoliosis, H/o PE, TIA, Parkinson's disease, chronic back pain, Vertigo, Anxiety,history of combined systolic and diastolic heart failure, Parkinson disease, and history of pulmonary embolism about one year ago family states that her Coumadin has been discontinued this she has finished course. Per family patient has been recently falling more often and has grown generally weak. Day of admission  she had an episode of where she slid down onto the floor. Given the fact that patient had had decreased by mouth intake and generalized fatigue she was brought in to emerge department by her family. Patient stayed home with 24-hour care.  The patient was brought in to emergency department she was found to be hypoxic on room air down to 80% which is not her baseline. Chest x-ray did not show any evidence of infiltrate. Given history of pulmonary embolism CT angiogram of the chest was considered.  But given her acute on chronic renal failure with creatinine elevation of 1.27 patient thought to be not a candidate for contrast. Hospitalist was called to admit for VQ scan in AM and further workup  Hospital Course:  . Hypoxia  -improved, weaned off O2  -CXR clear, no evidence of heart feailure  -VQ scan negative  -may have Sleep Apnea, transient mild hypoxia noted in deep sleep -needs Sleep study as outpatient to evaluate for OSA  . Dehydration  -improved with IVF, stopped IVF   . CKD (chronic kidney disease), stage III  -improved with rehydration   . Hypertension -  -resumed ACE, continue plendil  -wide fluctuations in BP from 95-150s likely due to autonomic neuropathy, asymptomatic  . CAD (coronary artery disease)  -stable, continue ASA, enzymes negative  -ECHO with LVH and LVOT -Fu with Cardiology  . Weakness generalized  - due to age, Parkinson's  - PT and OT evaluation completed, SNF recommended.   . Chronic combined systolic and diastolic CHF, NYHA class 3  -compensated, off IVF, ECHO with normal EF and grade 1 DD  -resume HCTZ at 12.5mg  daily   . Parkinson's disease  -reportedly h/o allergy with Sinemet -FU with Neurology for other options for treatment   Discharge Exam: Filed Vitals:   02/15/14 0446  BP: 152/62  Pulse: 60  Temp: 98.8 F (37.1 C)  Resp: 18    General: AAOx3 Cardiovascular: S1S2/RRR Respiratory: CTAB  Discharge Instructions You were cared for by a hospitalist during your hospital stay. If you have any questions  about your discharge medications or the care you received while you were in the hospital after you are discharged, you can call the unit and asked to speak with the hospitalist on call if the hospitalist that took care of you is not available. Once you are discharged, your primary care physician will handle any further medical issues. Please note that NO REFILLS for any discharge medications will be authorized once you are  discharged, as it is imperative that you return to your primary care physician (or establish a relationship with a primary care physician if you do not have one) for your aftercare needs so that they can reassess your need for medications and monitor your lab values.  Discharge Orders   Future Orders Complete By Expires   Diet - low sodium heart healthy  As directed    Increase activity slowly  As directed        Medication List         acetaminophen 325 MG tablet  Commonly known as:  TYLENOL  Take 650 mg by mouth every 6 (six) hours as needed for mild pain or headache.     aspirin EC 81 MG tablet  Take 81 mg by mouth daily.     atorvastatin 40 MG tablet  Commonly known as:  LIPITOR  Take 40 mg by mouth daily.     citalopram 10 MG tablet  Commonly known as:  CELEXA  Take 10 mg by mouth daily.     diazepam 5 MG tablet  Commonly known as:  VALIUM  Take 0.5 tablets (2.5 mg total) by mouth every 12 (twelve) hours as needed for anxiety. Take 1/2 tablet every morning and 1/2 tablet every evening at 5 pm.     felodipine 5 MG 24 hr tablet  Commonly known as:  PLENDIL  Take 5 mg by mouth daily.     gabapentin 300 MG capsule  Commonly known as:  NEURONTIN  Take 300 mg by mouth 2 (two) times daily. Take one capsule in the morning and 1 capsule at bedtime.     hydrochlorothiazide 12.5 MG capsule  Commonly known as:  MICROZIDE  Take 1 capsule by mouth every morning.     HYDROcodone-acetaminophen 7.5-325 MG per tablet  Commonly known as:  NORCO  Take 1 tablet by mouth every 6 (six) hours as needed for moderate pain.     lisinopril 10 MG tablet  Commonly known as:  PRINIVIL,ZESTRIL  Take 10 mg by mouth daily.     meclizine 25 MG tablet  Commonly known as:  ANTIVERT  Take 25 mg by mouth 3 (three) times daily as needed for dizziness.     rOPINIRole 0.25 MG tablet  Commonly known as:  REQUIP  Take 0.125-0.25 mg by mouth 2 (two) times daily. Take 1/2 tablet every morning and 1  whole tablet in the evening at 5 pm.       Allergies  Allergen Reactions  . Carbidopa-Levodopa Rash       Follow-up Information   Follow up with Kayla Ramos. Schedule an appointment as soon as possible for a visit in 1 week.   Specialty:  Family Medicine   Contact information:   [address] [place] [address] [phone]        The results of significant diagnostics from this hospitalization (including imaging, microbiology, ancillary and laboratory) are listed below for reference.    Significant Diagnostic Studies: Dg Chest 1 View  02/11/2014   CLINICAL DATA:  Bilateral hip  pain after a fall.  EXAM: CHEST - 1 VIEW  COMPARISON:  10/11/2013  FINDINGS: Postoperative changes in the mediastinum. Shallow inspiration. Mild Cardiac enlargement with normal pulmonary vascularity. Calcified and tortuous aorta. No focal airspace disease or pneumothorax.  IMPRESSION: No active disease.   Electronically Signed   By: Burman NievesWilliam  Stevens M.D.   On: 02/11/2014 23:42   X-ray Chest Pa And Lateral   02/12/2014   CLINICAL DATA:  Hypoxia.  EXAM: CHEST  2 VIEW  COMPARISON:  02/11/2014  FINDINGS: Cardiac silhouette is mildly enlarged. There are stable changes from prior CABG surgery. The mediastinal or hilar masses.  No acute findings in the lungs. No pleural effusion. No pneumothorax.  Bony thorax is diffusely demineralized but grossly intact.  IMPRESSION: No acute cardiopulmonary disease. No change from the previous study.   Electronically Signed   By: Amie Portlandavid  Ormond M.D.   On: 02/12/2014 11:18   Dg Hip Bilateral W/pelvis  02/11/2014   CLINICAL DATA:  Bilateral hip pain after a fall.  EXAM: BILATERAL HIP WITH PELVIS - 4+ VIEW  COMPARISON:  MRI lumbar spine 02/04/2009.  Abdomen 07/05/2005  FINDINGS: The pelvis and hips appear intact. No displaced fractures are identified. Degenerative changes in the lower lumbar spine and both hips. SI joints and symphysis pubis are not displaced.  IMPRESSION:  No acute bony abnormalities demonstrated.   Electronically Signed   By: Burman NievesWilliam  Stevens M.D.   On: 02/11/2014 23:41   Nm Pulmonary Perf And Vent  02/12/2014   CLINICAL DATA:  Hypoxia, history of pulmonary embolism  EXAM: NUCLEAR MEDICINE VENTILATION - PERFUSION LUNG SCAN  TECHNIQUE: Ventilation images were obtained in multiple projections using inhaled aerosol technetium 99 M DTPA. Perfusion images were obtained in multiple projections after intravenous injection of Tc-4688m MAA.  RADIOPHARMACEUTICALS:  40 mCi Tc-2588m DTPA aerosol and 5 mCi Tc-2088m MAA  COMPARISON:  DG CHEST 1 VIEW dated 02/11/2014  FINDINGS: Review of chest radiograph performed earlier same day demonstrates enlarged cardiac silhouette and mediastinal contours. There is mild elevation of the right hemidiaphragm with associated mild right-sided volume loss. Mild pulmonary venous congestion without evidence of edema. Trace bilateral effusions are not excluded. Moderate scoliotic curvature of the thoracolumbar spine with dominant curvature convex to the left.  Ventilation: There is mild undulation of the bilateral pulmonary parenchyma, left greater than right. There is retention of inhaled radiotracer within the trachea and left mainstem bronchus. Ingested radiotracer is seen within the hypopharynx and superior esophagus.  Perfusion: There is relative homogeneous distribution of injected radiotracer throughout the bilateral pulmonary parenchyma. Relative area of oligemia involving the medial aspect of the right mid lung is associated with a stripe sign. No discrete segmental or subsegmental mismatched filling defect to suggest pulmonary embolism.  IMPRESSION: Pulmonary embolism absent (low probability for pulmonary embolism).   Electronically Signed   By: Simonne ComeJohn  Watts M.D.   On: 02/12/2014 10:17    Microbiology: No results found for this or any previous visit (from the past 240 hour(s)).   Labs: Basic Metabolic Panel:  Recent Labs Lab  02/11/14 2145 02/12/14 0519 02/14/14 0433  NA 142 143 140  K 3.4* 3.4* 3.8  CL 101 104 103  CO2 27 29 28   GLUCOSE 183* 104* 90  BUN 17 17 13   CREATININE 1.27* 1.06 0.93  CALCIUM 8.6 8.3* 8.6  MG  --  1.6  --   PHOS  --  3.8  --    Liver Function Tests:  Recent Labs Lab 02/12/14  0519  AST 16  ALT 11  ALKPHOS 92  BILITOT 1.0  PROT 5.6*  ALBUMIN 3.0*   No results found for this basename: LIPASE, AMYLASE,  in the last 168 hours No results found for this basename: AMMONIA,  in the last 168 hours CBC:  Recent Labs Lab 02/11/14 2145 02/12/14 0519 02/14/14 0433  WBC 9.3 5.8 5.6  NEUTROABS 7.5  --   --   HGB 13.0 12.1 11.9*  HCT 38.9 36.9 36.0  MCV 92.6 92.9 92.5  PLT 219 193 185   Cardiac Enzymes:  Recent Labs Lab 02/12/14 0519 02/12/14 1128 02/12/14 1608  TROPONINI <0.30 <0.30 <0.30   BNP: BNP (last 3 results)  Recent Labs  09/07/13 0742 09/17/13 1754 02/11/14 2148  PROBNP 1089.0* 675.2* 1527.0*   CBG: No results found for this basename: GLUCAP,  in the last 168 hours     Signed:  Zannie CovePreetha Myrl Lazarus  [facility] 02/15/2014, 12:50 PM

## 2014-03-16 ENCOUNTER — Encounter: Payer: Self-pay | Admitting: Cardiology

## 2014-03-16 ENCOUNTER — Ambulatory Visit (INDEPENDENT_AMBULATORY_CARE_PROVIDER_SITE_OTHER): Payer: Medicare Other | Admitting: Cardiology

## 2014-03-16 VITALS — BP 135/67 | HR 64 | Ht 59.0 in

## 2014-03-16 DIAGNOSIS — I6529 Occlusion and stenosis of unspecified carotid artery: Secondary | ICD-10-CM

## 2014-03-16 DIAGNOSIS — I509 Heart failure, unspecified: Secondary | ICD-10-CM

## 2014-03-16 DIAGNOSIS — I5032 Chronic diastolic (congestive) heart failure: Secondary | ICD-10-CM

## 2014-03-16 NOTE — Patient Instructions (Addendum)
Your physician recommends that you continue on your current medications as directed. Please refer to the Current Medication list given to you today.   Your physician wants you to follow-up in: AUGUST WITH DR Johnell ComingsNELSON  You will receive a reminder letter in the mail two months in advance. If you don't receive a letter, please call our office to schedule the follow-up appointment.  Your physician has requested that you have a carotid duplex. This test is an ultrasound of the carotid arteries in your neck. It looks at blood flow through these arteries that supply the brain with blood. Allow one hour for this exam. There are no restrictions or special instructions.

## 2014-03-16 NOTE — Progress Notes (Signed)
Patient ID: Kayla MassMary L Ramos, female   DOB: [birth year], 80 y.o.   MRN: [id number]    Patient Name: Kayla MassMary L Ramos Date of Encounter: 03/16/2014  Primary Care Provider:  RECORD,CHARLES Primary Cardiologist:  Lars MassonKatarina H Fotios Amos  Problem List   Past Medical History  Diagnosis Date  . Hypertension   . Vertigo     "it's gotten better" (03/02/2013)  . CAD (coronary artery disease) 2000    s/p CABG   . Hyperlipidemia   . Scoliosis   . Vertebral artery stenosis 2006    75-90% per MR/MRA in 2006   . Pulmonary embolism 03/02/2013    "bilaterally; small" (03/02/2013)  . Exertional shortness of breath     "recently" (03/02/2013)  . Daily headache     "sometimes" (03/02/2013)  . GERD (gastroesophageal reflux disease)     "used to be bad; not so anymore" (03/02/2013)  . Arthritis     "in her back" (03/02/2013)  . TIA (transient ischemic attack) 07/2011  . Chronic lower back pain   . Anxiety   . Parkinson disease     "dx'd just 2 wk ago" (09/07/2013)   Past Surgical History  Procedure Laterality Date  . Aortic valve replacement  2000    "pig valve" (03/02/2013)  . Cholecystectomy  2000  . Vaginal hysterectomy  ~ 1987  . Coronary artery bypass graft  2000    "CABG X?" (03/02/2013)  . Cataract extraction w/ intraocular lens  implant, bilateral Bilateral 2005  . Cardiac valve replacement    . Coronary angioplasty  1989    Allergies  Allergies  Allergen Reactions  . Carbidopa-Levodopa Rash    HPI  A very pleasant 80 year old female with past medical history of Parkinson disease, coronary artery disease status post coronary artery bypass surgery and aortic valve replacement in 2000. Patient also has history of small pulmonary embolism in August of 2014, with completed 6 months of anticoagulation, TIA, and vertebral artery stenosis based on MRA in 2014. The patient was hospitalized in April of 2015 for generalized weakness and shortness of breath and was found to have elevated d-dimer with  negative VQ scan. Her oxygen saturation was in the 80s. The patient was found to be dehydrated and improved with IV hydration, and so did her CKD. stage III.  She is coming today with her daughter-in-law that seems to be very involved in her care. She states that the patient hasn't been eating or drinking well recently and occasions she feels dizzy that she attributes to her diagnosis of vertigo. No recent falls, no palpitations or chest pain. She walks minimally and only when necessary and denies resting shortness of breath. She also denies any lower extremity edema.  Home Medications  Prior to Admission medications   Medication Sig Start Date End Date Taking? Authorizing Provider  aspirin EC 81 MG tablet Take 81 mg by mouth daily.   Yes Historical Provider, MD  atorvastatin (LIPITOR) 40 MG tablet Take 40 mg by mouth daily.     Yes Historical Provider, MD  Cholecalciferol (VITAMIN D) 2000 UNITS tablet Take 2,000 Units by mouth daily.  03/14/14 03/14/15 Yes Historical Provider, MD  citalopram (CELEXA) 10 MG tablet Take 10 mg by mouth daily.   Yes Historical Provider, MD  diazepam (VALIUM) 5 MG tablet Take 0.5 tablets (2.5 mg total) by mouth every 12 (twelve) hours as needed for anxiety. Take 1/2 tablet every morning and 1/2 tablet every evening at 5 pm. 02/15/14  Yes Zannie CovePreetha Joseph,  MD  docusate sodium (COLACE) 100 MG capsule Take 1 capsule by mouth daily as needed.  02/12/14  Yes Historical Provider, MD  felodipine (PLENDIL) 5 MG 24 hr tablet Take 5 mg by mouth daily.     Yes Historical Provider, MD  gabapentin (NEURONTIN) 300 MG capsule Take 300 mg by mouth 2 (two) times daily. Take one capsule in the morning and 1 capsule at bedtime.   Yes Historical Provider, MD  hydrochlorothiazide (MICROZIDE) 12.5 MG capsule Take 1 capsule by mouth every morning. 01/14/14  Yes Historical Provider, MD  HYDROcodone-acetaminophen (NORCO/VICODIN) 5-325 MG per tablet Take 1 tablet by mouth every 6 (six) hours as needed  for moderate pain.   Yes Historical Provider, MD  lisinopril (PRINIVIL,ZESTRIL) 10 MG tablet Take 10 mg by mouth daily.   Yes Historical Provider, MD  meclizine (ANTIVERT) 25 MG tablet Take 25 mg by mouth 3 (three) times daily as needed for dizziness.   Yes Historical Provider, MD  rOPINIRole (REQUIP) 0.25 MG tablet Take 0.125-0.25 mg by mouth 2 (two) times daily. Take 1/2 tablet every morning and 1 whole tablet in the evening at 5 pm. 10/13/13  Yes Alba CoryBelkys A Regalado, MD    Family History  Family History  Problem Relation Age of Onset  . Hypertension Mother   . CAD Father   . Diabetes type II Other     Social History  History   Social History  . Marital Status: Widowed    Spouse Name: N/A    Number of Children: N/A  . Years of Education: N/A   Occupational History  . Not on file.   Social History Main Topics  . Smoking status: Never Smoker   . Smokeless tobacco: Never Used  . Alcohol Use: No  . Drug Use: No  . Sexual Activity: No   Other Topics Concern  . Not on file   Social History Narrative  . No narrative on file     Review of Systems, as per HPI, otherwise negative General:  No chills, fever, night sweats or weight changes.  Cardiovascular:  No chest pain, dyspnea on exertion, edema, orthopnea, palpitations, paroxysmal nocturnal dyspnea. Dermatological: No rash, lesions/masses Respiratory: No cough, dyspnea Urologic: No hematuria, dysuria Abdominal:   No nausea, vomiting, diarrhea, bright red blood per rectum, melena, or hematemesis Neurologic:  No visual changes, wkns, changes in mental status. All other systems reviewed and are otherwise negative except as noted above.  Physical Exam  Blood pressure 135/67, pulse 64, height 4\' 11"  (1.499 m), weight 0 lb (0 kg).  General: Pleasant, NAD Psych: Normal affect. Neuro: Alert and oriented X 3. Moves all extremities spontaneously. HEENT: Normal  Neck: Supple without bruits or JVD. Lungs:  Resp regular and  unlabored, CTA. Heart: RRR no s3, s4, or murmurs. Abdomen: Soft, non-tender, non-distended, BS + x 4.  Extremities: No clubbing, cyanosis or edema. DP/PT/Radials 2+ and equal bilaterally.  Labs:  No results found for this basename: CKTOTAL, CKMB, TROPONINI,  in the last 72 hours Lab Results  Component Value Date   WBC 5.6 02/14/2014   HGB 11.9* 02/14/2014   HCT 36.0 02/14/2014   MCV 92.5 02/14/2014   PLT 185 02/14/2014    Lab Results  Component Value Date   DDIMER 1.29* 03/02/2013   No components found with this basename: POCBNP,     Component Value Date/Time   NA 140 02/14/2014 0433   K 3.8 02/14/2014 0433   CL 103 02/14/2014 0433   CO2  28 02/14/2014 0433   GLUCOSE 90 02/14/2014 0433   BUN 13 02/14/2014 0433   CREATININE 0.93 02/14/2014 0433   CALCIUM 8.6 02/14/2014 0433   PROT 5.6* 02/12/2014 0519   ALBUMIN 3.0* 02/12/2014 0519   AST 16 02/12/2014 0519   ALT 11 02/12/2014 0519   ALKPHOS 92 02/12/2014 0519   BILITOT 1.0 02/12/2014 0519   GFRNONAA 54* 02/14/2014 0433   GFRAA 62* 02/14/2014 0433   Lab Results  Component Value Date   CHOL 130 03/03/2013   HDL 52 03/03/2013   LDLCALC 60 03/03/2013   TRIG 91 03/03/2013    Accessory Clinical Findings  Echocardiogram - 02/12/2014 Study Conclusions  - Left ventricle: The cavity size was normal. There was moderate concentric hypertrophy. The LVOT is very narrow with mild turbulence. Systolic function was normal. The estimated ejection fraction was in the range of 60% to 65%. There was dynamic obstruction, with mid-cavity obliteration. Wall motion was normal; there were no regional wall motion abnormalities. Doppler parameters are consistent with abnormal left ventricular relaxation (grade 1 diastolic dysfunction). - Left atrium: The atrium was severely dilated. - Right atrium: The atrium was moderately dilated. - Tricuspid valve: Moderate regurgitation. - Impressions: Technically difficuly study due to poor sound wave transmission.  THere is moderate concentric LVH with a very narrow LVOT and mid-cavity obliteration. Impressions:  - Technically difficuly study due to poor sound wave transmission. THere is moderate concentric LVH with a very narrow LVOT and mid-cavity obliteration.  ECG: SR, incomplete right bundle branch block negative daily sleep inferolateral leads. This is unchanged when compared to the EKG from 09/07/2013.    Assessment & Plan  This is a very pleasant 80 year old female with prior medical history of coronary artery disease and status post coronary artery bypass surgery + AVR in 2000.  1. CAD - The patient has been doing fairly well since her surgery and denies any chest pain or shortness of breath. Her most recent echocardiogram from April 2015 shows preserved left ventricular ejection fraction impaired relaxation.   2. chronic diastolic CHF  - grade 1 diastolic dysfunction with moderate LVH with fairly small left ventricular cavity with intracavitary gradient and small and the LVOT causing systolic gradient.   This is not a true HCM however physiologically behaved as HCM plan patient became short of breath, weak and dizzy after a period of poor intake associated dehydration. Currently the patient appears euvolemic to dry and coming Cardene her to increase her fluid intake. Ideally I would like to start her on beta blocker however her heart rate is 64 and therefore I will avoid using beta blockers.  I will follow patient in 3 months.  2. history of vertebral artery stenosis, with 2 recent episodes of dizziness and we will repeat carotid ultrasound including vertebral arteries.  3. Hypertension - controlled on current regimen  4. Lipids - checked recently by primary care physician and at goal.  Lars Masson, MD, [facility] 03/16/2014, 11:13 AM

## 2014-03-18 ENCOUNTER — Ambulatory Visit: Payer: Medicare Other | Admitting: Cardiovascular Disease

## 2014-03-22 ENCOUNTER — Ambulatory Visit (HOSPITAL_COMMUNITY): Payer: Medicare Other | Attending: Cardiovascular Disease | Admitting: Cardiology

## 2014-03-22 DIAGNOSIS — R42 Dizziness and giddiness: Secondary | ICD-10-CM

## 2014-03-22 DIAGNOSIS — I6529 Occlusion and stenosis of unspecified carotid artery: Secondary | ICD-10-CM

## 2014-03-22 NOTE — Progress Notes (Signed)
Carotid duplex complete 

## 2014-06-14 ENCOUNTER — Ambulatory Visit: Payer: Medicare Other | Admitting: Cardiology

## 2014-11-04 DEATH — deceased

## 2015-07-20 IMAGING — CR DG CHEST 1V PORT
1 series · 1 of 1 positions shown · non-contrast
Comparison: 03/02/2013

CLINICAL DATA: Chest pain

EXAM:
PORTABLE CHEST - 1 VIEW

[AP]
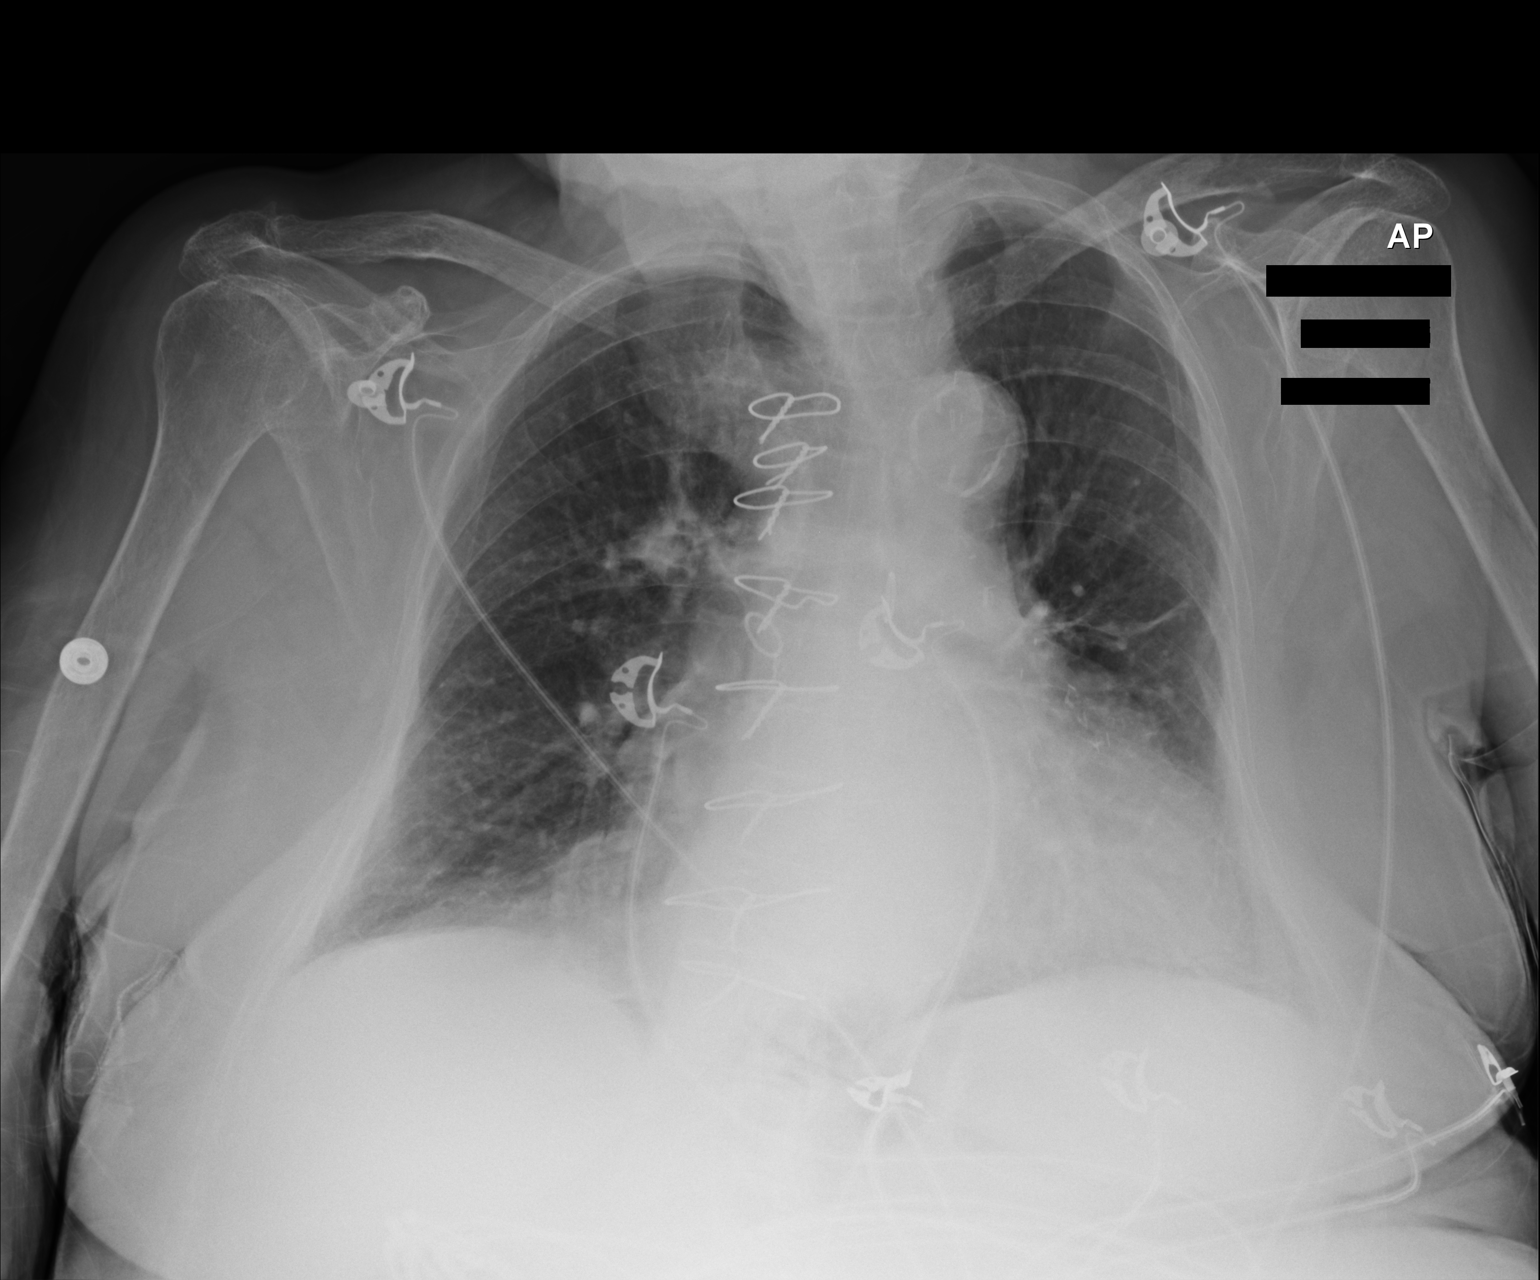

[1 of 1 positions shown; findings below may reference images not displayed]

FINDINGS: The cardiac shadow is stable. Postsurgical changes are again
identified. Some chronic changes are noted in the left mid lung. No
focal infiltrate is seen. No bony abnormality is noted.
IMPRESSION: No acute abnormality seen.
